# Patient Record
Sex: Male | Born: 1990 | Race: White | Hispanic: No | Marital: Single | Smoking: Never smoker
Health system: Southern US, Community
[De-identification: ages and names within clinical notes are randomized; demographics above are authoritative.]

## PROBLEM LIST (undated history)

## (undated) DIAGNOSIS — D6851 Activated protein C resistance: Secondary | ICD-10-CM

## (undated) DIAGNOSIS — I7300 Raynaud's syndrome without gangrene: Secondary | ICD-10-CM

## (undated) DIAGNOSIS — T7840XA Allergy, unspecified, initial encounter: Secondary | ICD-10-CM

## (undated) DIAGNOSIS — T753XXA Motion sickness, initial encounter: Secondary | ICD-10-CM

## (undated) DIAGNOSIS — J302 Other seasonal allergic rhinitis: Secondary | ICD-10-CM

## (undated) DIAGNOSIS — J45909 Unspecified asthma, uncomplicated: Secondary | ICD-10-CM

## (undated) HISTORY — DX: Other seasonal allergic rhinitis: J30.2

## (undated) HISTORY — DX: Activated protein C resistance: D68.51

## (undated) HISTORY — DX: Allergy, unspecified, initial encounter: T78.40XA

## (undated) HISTORY — PX: NO PAST SURGERIES: SHX2092

---

## 2014-12-18 ENCOUNTER — Ambulatory Visit: Payer: Self-pay | Admitting: Gastroenterology

## 2015-01-05 ENCOUNTER — Ambulatory Visit: Payer: Self-pay | Admitting: Gastroenterology

## 2015-01-07 ENCOUNTER — Other Ambulatory Visit: Payer: Self-pay

## 2015-01-07 DIAGNOSIS — J302 Other seasonal allergic rhinitis: Secondary | ICD-10-CM | POA: Insufficient documentation

## 2015-01-07 DIAGNOSIS — D6851 Activated protein C resistance: Secondary | ICD-10-CM | POA: Insufficient documentation

## 2015-01-07 HISTORY — DX: Other seasonal allergic rhinitis: J30.2

## 2015-01-07 HISTORY — DX: Activated protein C resistance: D68.51

## 2015-01-08 ENCOUNTER — Encounter: Payer: Self-pay | Admitting: Gastroenterology

## 2015-01-08 ENCOUNTER — Ambulatory Visit (INDEPENDENT_AMBULATORY_CARE_PROVIDER_SITE_OTHER): Payer: BC Managed Care – PPO | Admitting: Gastroenterology

## 2015-01-08 VITALS — BP 127/69 | HR 80 | Ht 65.0 in | Wt 139.0 lb

## 2015-01-08 DIAGNOSIS — R197 Diarrhea, unspecified: Secondary | ICD-10-CM

## 2015-01-08 DIAGNOSIS — R140 Abdominal distension (gaseous): Secondary | ICD-10-CM

## 2015-01-08 NOTE — Progress Notes (Signed)
Gastroenterology Consultation  Referring Provider:     No ref. provider found Primary Care Physician:  Rolm Gala, MD Primary Gastroenterologist:  Dr. Servando Snare     Reason for Consultation:     Diarrhea        HPI:   Miguel Rodgers is a 24 y.o. y/o male referred for consultation & management of Diarrhea by Dr. Rolm Gala, MD.  This patient comes today with a history of diarrhea. The patient states his diarrhea started after he took a course of antibiotics. The patient states that he has tried limiting multiple foods and still continues to have diarrhea. He also reports a lot of bloating and gas. Prior to having this diarrhea and bloating patient reports that he was having normal bowel movements. There is no report of any unexplained weight loss fevers chills nausea or vomiting. The patient does report that very infrequently the patient is having diarrhea wake him up from sleep but this is very infrequent. The patient usually states he wakes up for some other reason and has to have a bowel movement. The patient reports that he was on fiber but it made him have more gas. He also reports that he is tried varying robotics and states that they have had mixed results.  Past Medical History  Diagnosis Date  . Allergic rhinitis, seasonal 01/07/2015  . Heterozygous factor V Leiden mutation (HCC) 01/07/2015    History reviewed. No pertinent past surgical history.  Prior to Admission medications   Medication Sig Start Date End Date Taking? Authorizing Provider  cetirizine (ZYRTEC) 10 MG tablet Take by mouth.   Yes Historical Provider, MD  fluticasone (FLONASE) 50 MCG/ACT nasal spray by Nasal route.   Yes Historical Provider, MD  montelukast (SINGULAIR) 10 MG tablet  09/16/13  Yes Historical Provider, MD    Family History  Problem Relation Age of Onset  . Factor V Leiden deficiency Mother   . Lupus Mother   . Sleep apnea Mother   . Prostate cancer Maternal Grandfather      Social History    Substance Use Topics  . Smoking status: Never Smoker   . Smokeless tobacco: Never Used  . Alcohol Use: Yes     Comment: occasional     Allergies as of 01/08/2015 - Review Complete 01/08/2015  Allergen Reaction Noted  . Other Swelling 01/07/2015    Review of Systems:    All systems reviewed and negative except where noted in HPI.   Physical Exam:  BP 127/69 mmHg  Pulse 80  Ht  (1.651 m)  Wt 139 lb (63.05 kg)  BMI 23.13 kg/m2 No LMP for male patient. Psych:  Alert and cooperative. Normal mood and affect. General:   Alert,  Well-developed, well-nourished, pleasant and cooperative in NAD Head:  Normocephalic and atraumatic. Eyes:  Sclera clear, no icterus.   Conjunctiva pink. Ears:  Normal auditory acuity. Nose:  No deformity, discharge, or lesions. Mouth:  No deformity or lesions,oropharynx pink & moist. Neck:  Supple; no masses or thyromegaly. Lungs:  Respirations even and unlabored.  Clear throughout to auscultation.   No wheezes, crackles, or rhonchi. No acute distress. Heart:  Regular rate and rhythm; no murmurs, clicks, rubs, or gallops. Abdomen:  Normal bowel sounds.  No bruits.  Soft, non-tender and non-distended without masses, hepatosplenomegaly or hernias noted.  No guarding or rebound tenderness.  Negative Carnett sign.   Rectal:  Deferred.  Msk:  Symmetrical without gross deformities.  Good, equal movement & strength  bilaterally. Pulses:  Normal pulses noted. Extremities:  No clubbing or edema.  No cyanosis. Neurologic:  Alert and oriented x3;  grossly normal neurologically. Skin:  Intact without significant lesions or rashes.  No jaundice. Lymph Nodes:  No significant cervical adenopathy. Psych:  Alert and cooperative. Normal mood and affect.  Imaging Studies: No results found.  Assessment and Plan:   Miguel Rodgers is a 24 y.o. y/o male who reports having frequent diarrhea almost on a daily basis with loose stools in the morning and then they may get  heartburn in the afternoon. The patient states he can have anywhere from 3-10 bowel movements a day. This all started after he took antibiotics. 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 The patient has been told that he should also try Imodium and fiber when his diarrhea is very bad. If the patient does not get improvement from this the patient will contact me and may need to be set up for colonoscopy to rule out inflammatory bowel disease although this is much less likely. The patient and his family have been explained the plan and agree with it.   Note: This dictation was prepared with Dragon dictation along with smaller phrase technology. Any transcriptional errors that result from this process are unintentional.

## 2015-04-06 ENCOUNTER — Ambulatory Visit (INDEPENDENT_AMBULATORY_CARE_PROVIDER_SITE_OTHER): Payer: BC Managed Care – PPO | Admitting: Gastroenterology

## 2015-04-06 ENCOUNTER — Encounter: Payer: Self-pay | Admitting: Gastroenterology

## 2015-04-06 VITALS — BP 128/75 | HR 70 | Temp 98.1°F | Ht 65.0 in | Wt 142.0 lb

## 2015-04-06 DIAGNOSIS — R197 Diarrhea, unspecified: Secondary | ICD-10-CM | POA: Diagnosis not present

## 2015-04-06 MED ORDER — DESIPRAMINE HCL 50 MG PO TABS: 50.0000 mg | ORAL_TABLET | Freq: Every day | ORAL | Status: DC

## 2015-04-06 NOTE — Progress Notes (Signed)
   Primary Care Physician: Rolm GalaGRANDIS, HEIDI, MD  Primary Gastroenterologist:  Dr. Midge Miniumarren Alaze Garverick  Chief Complaint  Patient presents with  . Diarrhea    HPI: Miguel Rodgers is a 25 y.o. male here for follow-up of his diarrhea. The patient states that he has been taking the Imodium with mixed results. He also increase fiber in his diet with some mixed results. The patient does report that his probiotics have helped greatly but have not made his symptoms go away. He also ports that he continues to have a lot of flatulence. There is no report of any weight loss.  Current Outpatient Prescriptions  Medication Sig Dispense Refill  . Loperamide HCl (IMODIUM PO) Take by mouth as needed.    . Probiotic Product (VSL#3 PO) Take by mouth.    . cetirizine (ZYRTEC) 10 MG tablet Take by mouth. Reported on 04/06/2015    . desipramine (NORPRAMIN) 50 MG tablet Take 1 tablet (50 mg total) by mouth at bedtime. 30 tablet 2  . fluticasone (FLONASE) 50 MCG/ACT nasal spray Reported on 04/06/2015    . montelukast (SINGULAIR) 10 MG tablet Reported on 04/06/2015     No current facility-administered medications for this visit.    Allergies as of 04/06/2015 - Review Complete 04/06/2015  Allergen Reaction Noted  . Other Swelling 01/07/2015    ROS:  General: Negative for anorexia, weight loss, fever, chills, fatigue, weakness. ENT: Negative for hoarseness, difficulty swallowing , nasal congestion. CV: Negative for chest pain, angina, palpitations, dyspnea on exertion, peripheral edema.  Respiratory: Negative for dyspnea at rest, dyspnea on exertion, cough, sputum, wheezing.  GI: See history of present illness. GU:  Negative for dysuria, hematuria, urinary incontinence, urinary frequency, nocturnal urination.  Endo: Negative for unusual weight change.    Physical Examination:   BP 128/75 mmHg  Pulse 70  Temp(Src) 98.1 F (36.7 C) (Oral)  Ht 5\' 5"  (1.651 m)  Wt 142 lb (64.411 kg)  BMI 23.63 kg/m2  General:  Well-nourished, well-developed in no acute distress.  Eyes: No icterus. Conjunctivae pink. Mouth: Oropharyngeal mucosa moist and pink , no lesions erythema or exudate. Lungs: Clear to auscultation bilaterally. Non-labored. Heart: Regular rate and rhythm, no murmurs rubs or gallops.  Abdomen: Bowel sounds are normal, nontender, nondistended, no hepatosplenomegaly or masses, no abdominal bruits or hernia , no rebound or guarding.   Extremities: No lower extremity edema. No clubbing or deformities. Neuro: Alert and oriented x 3.  Grossly intact. Skin: Warm and dry, no jaundice.   Psych: Alert and cooperative, normal mood and affect.  Labs:    Imaging Studies: No results found.  Assessment and Plan:   Miguel Rodgers is a 25 y.o. y/o male who has a history of diarrhea after taking antibiotics. The patient continues to have diarrhea despite being helped by his VSL3 probiotics. The patient is hesitant to undergo colonoscopy which was recommended at the last office visit. The patient would like to try Desipramine before he goes to bed to see if this helps his symptoms. He will also contact us if he decides to go through with a colonoscopy to rule out any inflammatory bowel disease.   Note: This dictation was prepared with Dragon dictation along with smaller phrase technology. Any transcriptional errors that result from this process are unintentional.

## 2015-04-07 ENCOUNTER — Other Ambulatory Visit: Payer: Self-pay

## 2015-04-17 ENCOUNTER — Other Ambulatory Visit
Admission: RE | Admit: 2015-04-17 | Discharge: 2015-04-17 | Disposition: A | Discharge status: Home / Self Care | Payer: BC Managed Care – PPO | Source: Ambulatory Visit | Attending: Gastroenterology | Admitting: Gastroenterology

## 2015-04-17 ENCOUNTER — Inpatient Hospital Stay
Admission: EM | Admit: 2015-04-17 | Discharge: 2015-04-17 | Discharge status: Absent without leave | Payer: Self-pay | Source: Home / Self Care

## 2015-04-17 ENCOUNTER — Inpatient Hospital Stay: Admit: 2015-04-17 | Payer: Self-pay

## 2015-04-17 DIAGNOSIS — R197 Diarrhea, unspecified: Secondary | ICD-10-CM | POA: Insufficient documentation

## 2015-04-21 LAB — CELIAC DISEASE PANEL
Endomysial Ab, IgA: NEGATIVE
IGA: 233 mg/dL (ref 90–386)

## 2015-04-22 ENCOUNTER — Telehealth: Payer: Self-pay

## 2015-04-22 NOTE — Telephone Encounter (Signed)
-----   Message from Midge Minium, MD sent at 04/21/2015 10:46 AM EST ----- Let the patient know that his blood test for celiac sprue was negative.

## 2015-04-22 NOTE — Telephone Encounter (Signed)
LVM letting pt know his lab result was normal.

## 2015-04-28 ENCOUNTER — Encounter: Payer: Self-pay | Admitting: *Deleted

## 2015-04-28 NOTE — Discharge Instructions (Signed)

## 2015-05-01 ENCOUNTER — Ambulatory Visit
Admission: RE | Admit: 2015-05-01 | Discharge: 2015-05-01 | Disposition: A | Discharge status: Home / Self Care | Payer: BC Managed Care – PPO | Source: Ambulatory Visit | Attending: Gastroenterology | Admitting: Gastroenterology

## 2015-05-01 ENCOUNTER — Other Ambulatory Visit: Payer: Self-pay | Admitting: Gastroenterology

## 2015-05-01 ENCOUNTER — Encounter
Admission: RE | Disposition: A | Discharge status: Home / Self Care | Payer: Self-pay | Source: Ambulatory Visit | Attending: Gastroenterology

## 2015-05-01 ENCOUNTER — Ambulatory Visit: Payer: BC Managed Care – PPO | Admitting: Student in an Organized Health Care Education/Training Program

## 2015-05-01 DIAGNOSIS — Z8042 Family history of malignant neoplasm of prostate: Secondary | ICD-10-CM | POA: Insufficient documentation

## 2015-05-01 DIAGNOSIS — R197 Diarrhea, unspecified: Secondary | ICD-10-CM | POA: Diagnosis present

## 2015-05-01 DIAGNOSIS — K641 Second degree hemorrhoids: Secondary | ICD-10-CM | POA: Diagnosis not present

## 2015-05-01 DIAGNOSIS — Z832 Family history of diseases of the blood and blood-forming organs and certain disorders involving the immune mechanism: Secondary | ICD-10-CM | POA: Diagnosis not present

## 2015-05-01 DIAGNOSIS — J45909 Unspecified asthma, uncomplicated: Secondary | ICD-10-CM | POA: Diagnosis not present

## 2015-05-01 DIAGNOSIS — I7300 Raynaud's syndrome without gangrene: Secondary | ICD-10-CM | POA: Insufficient documentation

## 2015-05-01 DIAGNOSIS — Z79899 Other long term (current) drug therapy: Secondary | ICD-10-CM | POA: Diagnosis not present

## 2015-05-01 DIAGNOSIS — D6851 Activated protein C resistance: Secondary | ICD-10-CM | POA: Insufficient documentation

## 2015-05-01 DIAGNOSIS — K529 Noninfective gastroenteritis and colitis, unspecified: Secondary | ICD-10-CM | POA: Diagnosis not present

## 2015-05-01 HISTORY — DX: Unspecified asthma, uncomplicated: J45.909

## 2015-05-01 HISTORY — DX: Motion sickness, initial encounter: T75.3XXA

## 2015-05-01 HISTORY — PX: COLONOSCOPY WITH PROPOFOL: SHX5780

## 2015-05-01 HISTORY — DX: Raynaud's syndrome without gangrene: I73.00

## 2015-05-01 SURGERY — COLONOSCOPY WITH PROPOFOL
Anesthesia: Monitor Anesthesia Care

## 2015-05-01 MED ORDER — STERILE WATER FOR IRRIGATION IR SOLN
Status: DC | PRN
  Administered 2015-05-01: 10:00:00

## 2015-05-01 MED ORDER — LACTATED RINGERS IV SOLN
INTRAVENOUS | Status: DC
  Administered 2015-05-01: 09:00:00 via INTRAVENOUS

## 2015-05-01 MED ORDER — PROPOFOL 10 MG/ML IV BOLUS
INTRAVENOUS | Status: DC | PRN
  Administered 2015-05-01: 40 mg via INTRAVENOUS
  Administered 2015-05-01: 150 mg via INTRAVENOUS
  Administered 2015-05-01: 40 mg via INTRAVENOUS
  Administered 2015-05-01: 50 mg via INTRAVENOUS
  Administered 2015-05-01: 40 mg via INTRAVENOUS

## 2015-05-01 MED ORDER — SODIUM CHLORIDE 0.9 % IV SOLN: INTRAVENOUS | Status: DC

## 2015-05-01 MED ORDER — LIDOCAINE HCL (CARDIAC) 20 MG/ML IV SOLN
INTRAVENOUS | Status: DC | PRN
  Administered 2015-05-01: 50 mg via INTRAVENOUS

## 2015-05-01 SURGICAL SUPPLY — 28 items

## 2015-05-01 NOTE — Transfer of Care (Signed)
Immediate Anesthesia Transfer of Care Note  Patient: Miguel Rodgers  Procedure(s) Performed: Procedure(s): COLONOSCOPY WITH PROPOFOL (N/A)  Patient Location: PACU  Anesthesia Type: MAC  Level of Consciousness: awake, alert  and patient cooperative  Airway and Oxygen Therapy: Patient Spontanous Breathing and Patient connected to supplemental oxygen  Post-op Assessment: Post-op Vital signs reviewed, Patient's Cardiovascular Status Stable, Respiratory Function Stable, Patent Airway and No signs of Nausea or vomiting  Post-op Vital Signs: Reviewed and stable  Complications: No apparent anesthesia complications

## 2015-05-01 NOTE — Op Note (Signed)
[facility] Gastroenterology Patient Name: Miguel Rodgers Procedure Date: 05/01/2015 9:22 AM MRN: [id number] Account #: [id number] Date of Birth: 1990/12/24 Admit Type: Outpatient Age: 25 Room: [facility] OR ROOM 01 Gender: Male Note Status: Finalized Procedure:         Colonoscopy Indications:       Chronic diarrhea Providers:         Midge Minium, MD Referring MD:      Letitia Caul, MD (Referring MD) Medicines:         Propofol per Anesthesia Complications:     No immediate complications. Procedure:         Pre-Anesthesia Assessment:                    - Prior to the procedure, a History and Physical was                     performed, and patient medications and allergies were                     reviewed. The patient's tolerance of previous anesthesia                     was also reviewed. The risks and benefits of the procedure                     and the sedation options and risks were discussed with the                     patient. All questions were answered, and informed consent                     was obtained. Prior Anticoagulants: The patient has taken                     no previous anticoagulant or antiplatelet agents. ASA                     Grade Assessment: II - A patient with mild systemic                     disease. After reviewing the risks and benefits, the                     patient was deemed in satisfactory condition to undergo                     the procedure.                    After obtaining informed consent, the colonoscope was                     passed under direct vision. Throughout the procedure, the                     patient's blood pressure, pulse, and oxygen saturations                     were monitored continuously. The Olympus CF-HQ190L                     Colonoscope (S#. [id number]) was introduced through the anus  and advanced to the the terminal ileum. The colonoscopy                     was performed  without difficulty. The patient tolerated                     the procedure well. The quality of the bowel preparation                     was excellent. Findings:      The perianal and digital rectal examinations were normal.      The terminal ileum appeared normal. Biopsies were obtained in the       terminal ileum with cold forceps for histology.      Non-bleeding internal hemorrhoids were found during retroflexion. The       hemorrhoids were Grade II (internal hemorrhoids that prolapse but reduce       spontaneously).      Random biopsies were obtained with cold forceps for histology randomly       in the entire colon. Impression:        - The examined portion of the ileum was normal.                    - Non-bleeding internal hemorrhoids.                    - Biopsies were obtained in the terminal ileum.                    - Random biopsies were obtained in the entire colon. Recommendation:    - Await pathology results. Procedure Code(s): --- Professional ---                    [phone], Colonoscopy, flexible; with biopsy, single or                     multiple Diagnosis Code(s): --- Professional ---                    K52.9, Noninfective gastroenteritis and colitis,                     unspecified CPT copyright 2014 American Medical Association. All rights reserved. The codes documented in this report are preliminary and upon coder review may  be revised to meet current compliance requirements. Midge Minium, MD 05/01/2015 9:53:07 AM This report has been signed electronically. Number of Addenda: 0 Note Initiated On: 05/01/2015 9:22 AM Scope Withdrawal Time: 0 hours 6 minutes 24 seconds  Total Procedure Duration: 0 hours 7 minutes 55 seconds       [facility]

## 2015-05-01 NOTE — H&P (Signed)
  [facility]  [address], [address] [place], [place] [address] Phone: [phone] Fax : [phone]  Primary Care Physician:  Rolm Gala, MD Primary Gastroenterologist:  Dr. Servando Snare  Pre-Procedure History & Physical: HPI:  Miguel Rodgers is a 25 y.o. male is here for an colonoscopy.   Past Medical History  Diagnosis Date  . Allergic rhinitis, seasonal 01/07/2015  . Heterozygous factor V Leiden mutation (HCC) 01/07/2015  . Raynaud's syndrome   . Asthma     when seasonal allergies are severe  . Motion sickness     all moving vehicles    Past Surgical History  Procedure Laterality Date  . No past surgeries      Prior to Admission medications   Medication Sig Start Date End Date Taking? Authorizing Provider  desipramine (NORPRAMIN) 50 MG tablet Take 1 tablet (50 mg total) by mouth at bedtime. 04/06/15  Yes Midge Minium, MD  Loperamide HCl (IMODIUM PO) Take by mouth as needed.   Yes Historical Provider, MD  Probiotic Product (VSL#3 PO) Take by mouth.   Yes Historical Provider, MD  simethicone (MYLICON) 80 MG chewable tablet Chew 80 mg by mouth every 6 (six) hours as needed for flatulence.   Yes Historical Provider, MD    Allergies as of 04/07/2015 - Review Complete 04/06/2015  Allergen Reaction Noted  . Other Swelling 01/07/2015    Family History  Problem Relation Age of Onset  . Factor V Leiden deficiency Mother   . Lupus Mother   . Sleep apnea Mother   . Prostate cancer Maternal Grandfather     Social History   Social History  . Marital Status: Single    Spouse Name: N/A  . Number of Children: N/A  . Years of Education: N/A   Occupational History  . Not on file.   Social History Main Topics  . Smoking status: Never Smoker   . Smokeless tobacco: Never Used  . Alcohol Use: No     Comment: occasional   . Drug Use: No  . Sexual Activity: Not on file   Other Topics Concern  . Not on file   Social History Narrative    Review of Systems: See  HPI, otherwise negative ROS  Physical Exam: Ht  (1.651 m)  Wt 142 lb (64.411 kg)  BMI 23.63 kg/m2 General:   Alert,  pleasant and cooperative in NAD Head:  Normocephalic and atraumatic. Neck:  Supple; no masses or thyromegaly. Lungs:  Clear throughout to auscultation.    Heart:  Regular rate and rhythm. Abdomen:  Soft, nontender and nondistended. Normal bowel sounds, without guarding, and without rebound.   Neurologic:  Alert and  oriented x4;  grossly normal neurologically.  Impression/Plan: Miguel Rodgers is here for an colonoscopy to be performed for diarrhea  Risks, benefits, limitations, and alternatives regarding  colonoscopy have been reviewed with the patient.  Questions have been answered.  All parties agreeable.   Darlina Rumpf, MD  05/01/2015, 8:55 AM

## 2015-05-01 NOTE — Anesthesia Procedure Notes (Signed)
Procedure Name: MAC Performed by: Carmelia Tiner Pre-anesthesia Checklist: Patient identified, Emergency Drugs available, Suction available, Timeout performed and Patient being monitored Patient Re-evaluated:Patient Re-evaluated prior to inductionOxygen Delivery Method: Nasal cannula Placement Confirmation: positive ETCO2     

## 2015-05-01 NOTE — Anesthesia Postprocedure Evaluation (Signed)
Anesthesia Post Note  Patient: Miguel Rodgers  Procedure(s) Performed: Procedure(s) (LRB): COLONOSCOPY WITH PROPOFOL (N/A)  Patient location during evaluation: PACU Anesthesia Type: MAC Level of consciousness: awake and alert and oriented Pain management: pain level controlled Vital Signs Assessment: post-procedure vital signs reviewed and stable Respiratory status: spontaneous breathing and nonlabored ventilation Cardiovascular status: stable Postop Assessment: no signs of nausea or vomiting and adequate PO intake Anesthetic complications: no    Harolyn Rutherford

## 2015-05-01 NOTE — Anesthesia Preprocedure Evaluation (Signed)
Anesthesia Evaluation  Patient identified by MRN, date of birth, ID band Patient awake    Reviewed: Allergy & Precautions, NPO status , Patient's Chart, lab work & pertinent test results, reviewed documented beta blocker date and time   Airway Mallampati: I  TM Distance: >3 FB Neck ROM: Full    Dental no notable dental hx.    Pulmonary asthma ,    Pulmonary exam normal        Cardiovascular + Peripheral Vascular Disease  Normal cardiovascular exam     Neuro/Psych negative neurological ROS  negative psych ROS   GI/Hepatic negative GI ROS, Neg liver ROS,   Endo/Other  negative endocrine ROS  Renal/GU negative Renal ROS     Musculoskeletal negative musculoskeletal ROS (+)   Abdominal   Peds  Hematology negative hematology ROS (+)   Anesthesia Other Findings   Reproductive/Obstetrics                             Anesthesia Physical Anesthesia Plan  ASA: I  Anesthesia Plan: MAC   Post-op Pain Management:    Induction: Intravenous  Airway Management Planned:   Additional Equipment:   Intra-op Plan:   Post-operative Plan:   Informed Consent: I have reviewed the patients History and Physical, chart, labs and discussed the procedure including the risks, benefits and alternatives for the proposed anesthesia with the patient or authorized representative who has indicated his/her understanding and acceptance.     Plan Discussed with: CRNA  Anesthesia Plan Comments:         Anesthesia Quick Evaluation

## 2015-05-04 ENCOUNTER — Encounter: Payer: Self-pay | Admitting: Gastroenterology

## 2015-05-07 ENCOUNTER — Encounter: Payer: Self-pay | Admitting: Gastroenterology

## 2015-06-01 ENCOUNTER — Telehealth: Payer: Self-pay

## 2015-06-01 NOTE — Telephone Encounter (Signed)
Pt called stating the desipramine is making him have a slight shaky feeling in his hands. He wanted to know if this was a normal side effect of this medication. No other symptoms noted. Please advise.

## 2015-06-01 NOTE — Telephone Encounter (Signed)
Yes it can be associated with movement disorders but it means he needs to STOP the medication.

## 2015-06-02 NOTE — Telephone Encounter (Signed)
LVM letting pt know per Dr. Servando SnareWohl, this could be a side effect and for him to stop medication.

## 2015-06-03 NOTE — Telephone Encounter (Signed)
Pt called back and scheduled a follow up appt.

## 2015-06-05 DIAGNOSIS — K580 Irritable bowel syndrome with diarrhea: Secondary | ICD-10-CM | POA: Insufficient documentation

## 2015-07-01 ENCOUNTER — Ambulatory Visit (INDEPENDENT_AMBULATORY_CARE_PROVIDER_SITE_OTHER): Payer: BC Managed Care – PPO | Admitting: Gastroenterology

## 2015-07-01 ENCOUNTER — Encounter: Payer: Self-pay | Admitting: Gastroenterology

## 2015-07-01 ENCOUNTER — Other Ambulatory Visit: Payer: Self-pay

## 2015-07-01 VITALS — BP 127/73 | HR 74 | Temp 98.2°F | Ht 65.0 in | Wt 144.0 lb

## 2015-07-01 DIAGNOSIS — K589 Irritable bowel syndrome without diarrhea: Secondary | ICD-10-CM

## 2015-07-01 MED ORDER — CITALOPRAM HYDROBROMIDE 10 MG PO TABS: 10.0000 mg | ORAL_TABLET | Freq: Every day | ORAL | Status: DC

## 2015-07-01 NOTE — Progress Notes (Signed)
   Primary Care Physician: Rolm GalaGRANDIS, HEIDI, MD  Primary Gastroenterologist:  Dr. Midge Miniumarren [place]  Chief Complaint  Patient presents with  . follow up colonoscopy  . Diarrhea    HPI: Miguel Rodgers is a 25 y.o. male here for follow-up of his irritable bowel syndrome. The patient was doing much better on the desipramine but had side effects. He reports that after stopping the medication because of having shakes he looked back and is noted found out that he had tremors even before starting the medication. The patient states that when he was on the medication they just became more frequent. The patient has been doing well recently except for symptoms of come back. The colonoscopy did not show any cause for his symptoms. The patient would like to try another medication for his irritable bowel syndrome.  Current Outpatient Prescriptions  Medication Sig Dispense Refill  . diphenhydrAMINE (BENADRYL) 25 mg capsule Take by mouth.    . Loperamide HCl (IMODIUM PO) Take by mouth as needed.    . citalopram (CELEXA) 10 MG tablet Take 1 tablet (10 mg total) by mouth daily. 30 tablet 4  . desipramine (NORPRAMIN) 50 MG tablet Take 1 tablet (50 mg total) by mouth at bedtime. (Patient not taking: Reported on 07/01/2015) 30 tablet 2  . dicyclomine (BENTYL) 20 MG tablet Take by mouth. Reported on 07/01/2015    . Probiotic Product (VSL#3 PO) Take by mouth. Reported on 07/01/2015    . simethicone (MYLICON) 80 MG chewable tablet Chew 80 mg by mouth every 6 (six) hours as needed for flatulence. Reported on 07/01/2015     No current facility-administered medications for this visit.    Allergies as of 07/01/2015 - Review Complete 07/01/2015  Allergen Reaction Noted  . Other Swelling 01/07/2015    ROS:  General: Negative for anorexia, weight loss, fever, chills, fatigue, weakness. ENT: Negative for hoarseness, difficulty swallowing , nasal congestion. CV: Negative for chest pain, angina, palpitations, dyspnea on exertion,  peripheral edema.  Respiratory: Negative for dyspnea at rest, dyspnea on exertion, cough, sputum, wheezing.  GI: See history of present illness. GU:  Negative for dysuria, hematuria, urinary incontinence, urinary frequency, nocturnal urination.  Endo: Negative for unusual weight change.    Physical Examination:   BP 127/73 mmHg  Pulse 74  Temp(Src) 98.2 F (36.8 C) (Oral)  Ht 5\' 5"  (1.651 m)  Wt 144 lb (65.318 kg)  BMI 23.96 kg/m2  General: Well-nourished, well-developed in no acute distress.  Eyes: No icterus. Conjunctivae pink. Skin: Warm and dry, no jaundice.   Psych: Alert and cooperative, normal mood and affect.  Labs:    Imaging Studies: No results found.  Assessment and Plan:   Miguel ColonelJacob Dambrosio is a 25 y.o. y/o male comes in today with continued irritable bowel syndrome symptoms. He was completely symptom free on on the try cyclic antidepressants and had side effects. The patient will be tried on Celexa. The patient has been reassured that irritable bowel syndrome is what is affecting him. The patient has been told to contact me if this medication does not work.   Note: This dictation was prepared with Dragon dictation along with smaller phrase technology. Any transcriptional errors that result from this process are unintentional.

## 2015-07-13 ENCOUNTER — Telehealth: Payer: Self-pay

## 2015-07-21 NOTE — Telephone Encounter (Signed)
LVM for pt to return my call.

## 2015-07-21 NOTE — Telephone Encounter (Signed)
Miguel Rodgers would like for you to call him asap regarding some medication. He has been waiting for you to call him back. He also wanted me to tell you he made an appointment with a neurologist on [place] in [place].

## 2015-07-23 NOTE — Telephone Encounter (Signed)
[facility]

## 2015-07-23 NOTE — Telephone Encounter (Signed)
After reviewing the chart and speaking with the nurse, patient will need a referral [facility] for further workup of IBS as he has been having abnormal side effects to 2 medications that have already been given.   Patient is in agreement with this.  Please create referral at this time and have patient seen as soon as possible.

## 2015-07-27 NOTE — Telephone Encounter (Signed)
I have faxed a referral with clinic notes, labs, pathology and imaging to [facility] @ [phone]. [facility] by [facility] with [facility] date and time once the referral is reviewed. I have spoken with a receptionist at [facility] and she states it may take up to 2 weeks to make this appointment. I have advised that patient.

## 2015-08-21 ENCOUNTER — Ambulatory Visit
Admission: RE | Admit: 2015-08-21 | Discharge: 2015-08-21 | Disposition: A | Discharge status: Home / Self Care | Payer: BC Managed Care – PPO | Source: Ambulatory Visit | Attending: Medical Oncology | Admitting: Medical Oncology

## 2015-08-21 ENCOUNTER — Encounter: Payer: Self-pay | Admitting: Emergency Medicine

## 2015-08-21 ENCOUNTER — Other Ambulatory Visit: Payer: Self-pay | Admitting: Medical Oncology

## 2015-08-21 ENCOUNTER — Emergency Department
Admission: EM | Admit: 2015-08-21 | Discharge: 2015-08-21 | Disposition: A | Discharge status: Home / Self Care | Payer: BC Managed Care – PPO | Attending: Emergency Medicine | Admitting: Emergency Medicine

## 2015-08-21 DIAGNOSIS — M79662 Pain in left lower leg: Secondary | ICD-10-CM | POA: Insufficient documentation

## 2015-08-21 DIAGNOSIS — D6851 Activated protein C resistance: Secondary | ICD-10-CM | POA: Diagnosis not present

## 2015-08-21 DIAGNOSIS — I82412 Acute embolism and thrombosis of left femoral vein: Secondary | ICD-10-CM | POA: Diagnosis present

## 2015-08-21 DIAGNOSIS — I240 Acute coronary thrombosis not resulting in myocardial infarction: Secondary | ICD-10-CM | POA: Insufficient documentation

## 2015-08-21 DIAGNOSIS — J45909 Unspecified asthma, uncomplicated: Secondary | ICD-10-CM | POA: Diagnosis not present

## 2015-08-21 DIAGNOSIS — I82432 Acute embolism and thrombosis of left popliteal vein: Secondary | ICD-10-CM

## 2015-08-21 DIAGNOSIS — I82402 Acute embolism and thrombosis of unspecified deep veins of left lower extremity: Secondary | ICD-10-CM | POA: Insufficient documentation

## 2015-08-21 DIAGNOSIS — I7300 Raynaud's syndrome without gangrene: Secondary | ICD-10-CM | POA: Insufficient documentation

## 2015-08-21 LAB — BASIC METABOLIC PANEL
ANION GAP: 12 (ref 5–15)
BUN: 12 mg/dL (ref 6–20)
CHLORIDE: 101 mmol/L (ref 101–111)
CO2: 25 mmol/L (ref 22–32)
CREATININE: 0.87 mg/dL (ref 0.61–1.24)
Calcium: 9.9 mg/dL (ref 8.9–10.3)
GFR calc non Af Amer: >60 mL/min (ref >60)
GLUCOSE: 94 mg/dL (ref 65–99)
Potassium: 3.9 mmol/L (ref 3.5–5.1)
Sodium: 138 mmol/L (ref 135–145)

## 2015-08-21 LAB — CBC WITH DIFFERENTIAL/PLATELET
Basophils Absolute: 0 10*3/uL (ref 0–0.1)
Eosinophils Absolute: 0 10*3/uL (ref 0–0.7)
Eosinophils Relative: 0 %
HEMATOCRIT: 46.4 % (ref 40.0–52.0)
HEMOGLOBIN: 16 g/dL (ref 13.0–18.0)
Lymphs Abs: 0.9 10*3/uL — ABNORMAL LOW (ref 1.0–3.6)
MCH: 28.9 pg (ref 26.0–34.0)
MCHC: 34.6 g/dL (ref 32.0–36.0)
MCV: 83.6 fL (ref 80.0–100.0)
Monocytes Absolute: 0.9 10*3/uL (ref 0.2–1.0)
NEUTROS ABS: 11.1 10*3/uL — AB (ref 1.4–6.5)
Platelets: 169 10*3/uL (ref 150–440)
RBC: 5.55 MIL/uL (ref 4.40–5.90)
RDW: 12.8 % (ref 11.5–14.5)
WBC: 13 10*3/uL — ABNORMAL HIGH (ref 3.8–10.6)

## 2015-08-21 LAB — PROTIME-INR
INR: 1.16
Prothrombin Time: 15 seconds (ref 11.4–15.0)

## 2015-08-21 LAB — APTT: APTT: 31 s (ref 24–36)

## 2015-08-21 MED ORDER — HYDROCODONE-ACETAMINOPHEN 5-325 MG PO TABS: 1.0000 | ORAL_TABLET | ORAL | Status: DC | PRN

## 2015-08-21 MED ORDER — APIXABAN 5 MG PO TABS: ORAL_TABLET | ORAL | Status: DC

## 2015-08-21 MED ORDER — APIXABAN 5 MG PO TABS
10.0000 mg | ORAL_TABLET | ORAL | Status: AC
  Administered 2015-08-21: 10 mg via ORAL
  Filled 2015-08-21: qty 2

## 2015-08-21 MED ORDER — DOCUSATE SODIUM 100 MG PO CAPS: ORAL_CAPSULE | ORAL | Status: DC

## 2015-08-21 NOTE — ED Notes (Addendum)
Pt states he started having left leg pain 2 weeks ago, it did get better, but states 2 nights ago he slept on it wrong and pain has been increased ever since. Pt c/o pain to calf, behind the knee, and to thigh.  Pt did have US done today that was positive for DVT.  Pt has Factor V syndrome along with mother who also has the same and with DVT hx.

## 2015-08-21 NOTE — ED Provider Notes (Signed)
[facility] Emergency Department Provider Note  ____________________________________________  Time seen: Approximately 6:35 PM  I have reviewed the triage vital signs and the nursing notes.   HISTORY  Chief Complaint DVT    HPI Miguel Rodgers is a 25 y.o. male with a known history of factor V Leiden mutation who presents with vaginal onset of left leg pain for about 2 weeks but that it got acutely worse 2 days ago.  He describes the pain as moderate, worse with ambulation, and is a deep aching pain that starts in the middle of his thigh and extends down and slightly below his knee.He denies fever/chills, chest pain, shortness of breath, nausea, vomiting, diarrhea, abdominal pain.  He had an ultrasound done as an outpatient today which revealed a new DVT.  He has never had a DVT or any Blood clot in the past although his mother has had multiple blood clots.  He denies any numbness or tingling in the extremity.  He states that it is warm to the touch.  He is able to walk even that is somewhat painful.  He drives it as feeling tight and swollen although it does not look much different from normal.   Past Medical History  Diagnosis Date  . Allergic rhinitis, seasonal 01/07/2015  . Heterozygous factor V Leiden mutation ([place]) 01/07/2015  . Raynaud's syndrome   . Asthma     when seasonal allergies are severe  . Motion sickness     all moving vehicles    Patient Active Problem List   Diagnosis Date Noted  . Irritable bowel syndrome with diarrhea 06/05/2015  . Noninfectious diarrhea   . Heterozygous factor V Leiden mutation ([place]) 01/07/2015  . Allergic rhinitis, seasonal 01/07/2015    Past Surgical History  Procedure Laterality Date  . No past surgeries    . Colonoscopy with propofol N/A 05/01/2015    Procedure: COLONOSCOPY WITH PROPOFOL;  Surgeon: Lucilla Lame, MD;  Location: [place];  Service: Endoscopy;  Laterality: N/A;    Current  Outpatient Rx  Name  Route  Sig  Dispense  Refill  . desipramine (NORPRAMIN) 50 MG tablet   Oral   Take 1 tablet (50 mg total) by mouth at bedtime. Patient taking differently: Take 20 mg by mouth at bedtime.    30 tablet   2   . apixaban (ELIQUIS) 5 MG TABS tablet      Take 2 tablets (10 mg) by mouth twice daily for 7 days.  Then take 1 tablet (5 mg) by mouth twice daily.   74 tablet   1   . citalopram (CELEXA) 10 MG tablet   Oral   Take 1 tablet (10 mg total) by mouth daily.   30 tablet   4   . dicyclomine (BENTYL) 20 MG tablet   Oral   Take by mouth. Reported on 07/01/2015         . diphenhydrAMINE (BENADRYL) 25 mg capsule   Oral   Take by mouth.         . docusate sodium (COLACE) 100 MG capsule      Take 1 tablet once or twice daily as needed for constipation while taking narcotic pain medicine   30 capsule   0   . HYDROcodone-acetaminophen (NORCO/VICODIN) 5-325 MG tablet   Oral   Take 1-2 tablets by mouth every 4 (four) hours as needed for moderate pain.   15 tablet   0   . Loperamide HCl (IMODIUM  PO)   Oral   Take by mouth as needed.         . Probiotic Product (VSL#3 PO)   Oral   Take by mouth. Reported on 07/01/2015         . simethicone (MYLICON) 80 MG chewable tablet   Oral   Chew 80 mg by mouth every 6 (six) hours as needed for flatulence. Reported on 07/01/2015           Allergies Other  Family History  Problem Relation Age of Onset  . Factor V Leiden deficiency Mother   . Lupus Mother   . Sleep apnea Mother   . Prostate cancer Maternal Grandfather     Social History Social History  Substance Use Topics  . Smoking status: Never Smoker   . Smokeless tobacco: Never Used  . Alcohol Use: No     Comment: occasional     Review of Systems Constitutional: No fever/chills Eyes: No visual changes. ENT: No sore throat. Cardiovascular: Denies chest pain. Respiratory: Denies shortness of breath. Gastrointestinal: No abdominal pain.   No nausea, no vomiting.  No diarrhea.  No constipation. Genitourinary: Negative for dysuria. Musculoskeletal: Negative for back pain. Skin: Negative for rash. Neurological: Negative for headaches, focal weakness or numbness.  10-point ROS otherwise negative.  ____________________________________________   PHYSICAL EXAM:  VITAL SIGNS: ED Triage Vitals  Enc Vitals Group     BP 08/21/15 1651 133/76 mmHg     Pulse Rate 08/21/15 1651 100     Resp 08/21/15 1651 18     Temp 08/21/15 1651 98.4 F (36.9 C)     Temp Source 08/21/15 1651 Oral     SpO2 08/21/15 1651 100 %     Weight 08/21/15 1651 140 lb (63.504 kg)     Height 08/21/15 1651 5' 5"  (1.651 m)     Head Cir --      Peak Flow --      Pain Score 08/21/15 1653 8     Pain Loc --      Pain Edu? --      Excl. in [place]? --     Constitutional: Alert and oriented. Well appearing and in no acute distress. Eyes: Conjunctivae are normal. PERRL. EOMI. Head: Atraumatic. Nose: No congestion/rhinnorhea. Mouth/Throat: Mucous membranes are moist.  Oropharynx non-erythematous. Neck: No stridor.  No meningeal signs.   Cardiovascular: Normal rate, regular rhythm. Good peripheral circulation. Grossly normal heart sounds.   Respiratory: Normal respiratory effort.  No retractions. Lungs CTAB. Gastrointestinal: Soft and nontender. No distention.  Musculoskeletal: No appreciable difference between the left and right legs.  Slightly decreased capillary refill on the top of the left foot but the foot is warm with palpable pulses that are equal between the 2 feet. Neurologic:  Normal speech and language. No gross focal neurologic deficits are appreciated.  Skin:  Skin is warm, dry and intact. No rash noted.   ____________________________________________   LABS (all labs ordered are listed, but only abnormal results are displayed)  Labs Reviewed  CBC WITH DIFFERENTIAL/PLATELET - Abnormal; Notable for the following:    WBC 13.0 (*)    Neutro Abs  11.1 (*)    Lymphs Abs 0.9 (*)    All other components within normal limits  BASIC METABOLIC PANEL  PROTIME-INR  APTT   ____________________________________________  EKG  None ____________________________________________  RADIOLOGY   US Venous Img Lower Unilateral Left  08/21/2015  CLINICAL DATA:  Left calf pain and edema. History of factor 5  Leiden. EXAM: LEFT LOWER EXTREMITY VENOUS DOPPLER ULTRASOUND TECHNIQUE: Gray-scale sonography with graded compression, as well as color Doppler and duplex ultrasound were performed to evaluate the lower extremity deep venous systems from the level of the common femoral vein and including the common femoral, femoral, profunda femoral, popliteal and calf veins including the posterior tibial, peroneal and gastrocnemius veins when visible. The superficial great saphenous vein was also interrogated. Spectral Doppler was utilized to evaluate flow at rest and with distal augmentation maneuvers in the common femoral, femoral and popliteal veins. COMPARISON:  None. FINDINGS: Contralateral Common Femoral Vein: Respiratory phasicity is normal and symmetric with the symptomatic side. No evidence of thrombus. Normal compressibility. Common Femoral Vein: No evidence of thrombus. Normal compressibility, respiratory phasicity and response to augmentation. Saphenofemoral Junction: No evidence of thrombus. Normal compressibility and flow on color Doppler imaging. Profunda Femoral Vein: No evidence of thrombus. Normal compressibility, respiratory phasicity and response to augmentation. Femoral Vein: Beginning in the mid thigh, there is evidence of DVT within the femoral vein which is nonocclusive in the mid thigh and becomes occlusive in the distal thigh. Popliteal Vein: Occlusive thrombus identified throughout the popliteal vein. Calf Veins: Thrombus extends into visualized peroneal and posterior tibial veins. Superficial Great Saphenous Vein: No evidence of thrombus. Normal  compressibility and flow on color Doppler imaging. Venous Reflux:  None. Other Findings: No evidence of superficial thrombophlebitis or abnormal fluid collection. IMPRESSION: The exam is positive for DVT of the left lower extremity beginning in the femoral vein at the mid thigh level. Occlusive thrombus is present in the distal thigh, popliteal vein and visualized calf veins. Electronically Signed   By: Aletta Edouard M.D.   On: 08/21/2015 16:07    ____________________________________________   PROCEDURES  Procedure(s) performed: None  Critical Care performed: No ____________________________________________   INITIAL IMPRESSION / ASSESSMENT AND PLAN / ED COURSE  Pertinent labs & imaging results that were available during my care of the patient were reviewed by me and considered in my medical decision making (see chart for details).  The patient has acute DVT in the setting of factor V Leiden mutation.  He has not followed by a hematologist and is not on any anticoagulation.  I spoke with Dr. Yevette Edwards and ask about preference of anticoagulant for this patient.  He recommended that I speak with vascular surgery to make sure they did not want to do thrombolytics.  I spoke with Dr. Lorenso Courier by phone, went over all the details of the physical exam and ultrasound findings, and she agreed that there is no indication for thrombolytics at this time.  She also agreed with the plan to start the patient on Eliquis.  I started him on the first dose of Eliquis since the pharmacies are closed and provided a prescription.  He will follow up with the otologist in about 2 weeks.  He provided the Eliquis starter kit for a discount at the pharmacy.   ____________________________________________  FINAL CLINICAL IMPRESSION(S) / ED DIAGNOSES  Final diagnoses:  Acute deep vein thrombosis (DVT) of femoral vein of left lower extremity (HCC)  Acute deep vein thrombosis (DVT) of popliteal vein of left lower extremity  (HCC)  Factor V Leiden ([place])     MEDICATIONS GIVEN DURING THIS VISIT:  Medications  apixaban (ELIQUIS) tablet 10 mg (10 mg Oral Given 08/21/15 2022)     NEW OUTPATIENT MEDICATIONS STARTED DURING THIS VISIT:  Discharge Medication List as of 08/21/2015  8:08 PM    START taking these medications  Details  apixaban (ELIQUIS) 5 MG TABS tablet Take 2 tablets (10 mg) by mouth twice daily for 7 days.  Then take 1 tablet (5 mg) by mouth twice daily., Print    docusate sodium (COLACE) 100 MG capsule Take 1 tablet once or twice daily as needed for constipation while taking narcotic pain medicine, Print    HYDROcodone-acetaminophen (NORCO/VICODIN) 5-325 MG tablet Take 1-2 tablets by mouth every 4 (four) hours as needed for moderate pain., Starting 08/21/2015, Until Discontinued, Print          Note:  This document was prepared using Dragon voice recognition software and may include unintentional dictation errors.   Hinda Kehr, MD 08/21/15 [phone]

## 2015-08-21 NOTE — Discharge Instructions (Signed)
You have been seen in the Emergency Department (ED) today and diagnosed with a deep vein thrombosis (DVT), which is a blood clot in one of your veins.  As we discussed, we are starting you on a blood thinner.  Though these carry with them the risk of bleeding, the risks of not treating your DVT (stroke, a clot in your lungs, etc.) are greater than the risk of taking the medication.  Please follow up with the doctor listed in these papers as recommended regarding today's ED visit.      Return to the ED if you have worsening pain, ANY trouble breathing, chest pain, or other new or worsening symptoms that concern you.   Deep Vein Thrombosis A deep vein thrombosis (DVT) is a blood clot (thrombus) that usually occurs in a deep, larger vein of the lower leg or the pelvis, or in an upper extremity such as the arm. These are dangerous and can lead to serious and even life-threatening complications if the clot travels to the lungs. A DVT can damage the valves in your leg veins so that instead of flowing upward, the blood pools in the lower leg. This is called post-thrombotic syndrome, and it can result in pain, swelling, discoloration, and sores on the leg. CAUSES A DVT is caused by the formation of a blood clot in your leg, pelvis, or arm. Usually, several things contribute to the formation of blood clots. A clot may develop when:  Your blood flow slows down.  Your vein becomes damaged in some way.  You have a condition that makes your blood clot more easily. RISK FACTORS A DVT is more likely to develop in:  People who are older, especially over 37 years of age.  People who are overweight (obese).  People who sit or lie still for a long time, such as during long-distance travel (over 4 hours), bed rest, hospitalization, or during recovery from certain medical conditions like a stroke.  People who do not engage in much physical activity (sedentary lifestyle).  People who have chronic breathing  disorders.  People who have a personal or family history of blood clots or blood clotting disease.  People who have peripheral vascular disease (PVD), diabetes, or some types of cancer.  People who have heart disease, especially if the person had a recent heart attack or has congestive heart failure.  People who have neurological diseases that affect the legs (leg paresis).  People who have had a traumatic injury, such as breaking a hip or leg.  People who have recently had major or lengthy surgery, especially on the hip, knee, or abdomen.  People who have had a central line placed inside a large vein.  People who take medicines that contain the hormone estrogen. These include birth control pills and hormone replacement therapy.  Pregnancy or during childbirth or the postpartum period.  Long plane flights (over 8 hours). SIGNS AND SYMPTOMS Symptoms of a DVT can include:   Swelling of your leg or arm, especially if one side is much worse.  Warmth and redness of your leg or arm, especially if one side is much worse.  Pain in your arm or leg. If the clot is in your leg, symptoms may be more noticeable or worse when you stand or walk.  A feeling of pins and needles, if the clot is in the arm. The symptoms of a DVT that has traveled to the lungs (pulmonary embolism, PE) usually start suddenly and include:  Shortness of breath  while active or at rest.  Coughing or coughing up blood or blood-tinged mucus.  Chest pain that is often worse with deep breaths.  Rapid or irregular heartbeat.  Feeling light-headed or dizzy.  Fainting.  Feeling anxious.  Sweating. There may also be pain and swelling in a leg if that is where the blood clot started. These symptoms may represent a serious problem that is an emergency. Do not wait to see if the symptoms will go away. Get medical help right away. Call your local emergency services (911 in the [place] Do not drive yourself to the  hospital. DIAGNOSIS Your health care provider will take a medical history and perform a physical exam. You may also have other tests, including:  Blood tests to assess the clotting properties of your blood.  Imaging tests, such as CT, ultrasound, MRI, X-ray, and other tests to see if you have clots anywhere in your body. TREATMENT After a DVT is identified, it can be treated. The type of treatment that you receive depends on many factors, such as the cause of your DVT, your risk for bleeding or developing more clots, and other medical conditions that you have. Sometimes, a combination of treatments is necessary. Treatment options may be combined and include:  Monitoring the blood clot with ultrasound.  Taking medicines by mouth, such as newer blood thinners (anticoagulants), thrombolytics, or warfarin.  Taking anticoagulant medicine by injection or through an IV tube.  Wearing compression stockings or using different types ofdevices.  Surgery (rare) to remove the blood clot or to place a filter in your abdomen to stop the blood clot from traveling to your lungs. Treatments for a DVT are often divided into immediate treatment and long-term treatment (up to 3 months after DVT). You can work with your health care provider to choose the treatment program that is best for you. HOME CARE INSTRUCTIONS If you are taking a newer oral anticoagulant:  Take the medicine every single day at the same time each day.  Understand what foods and drugs interact with this medicine.  Understand that there are no regular blood tests required when using this medicine.  Understand the side effects of this medicine, including excessive bruising or bleeding. Ask your health care provider or pharmacist about other possible side effects. If you are taking warfarin:  Understand how to take warfarin and know which foods can affect how warfarin works in [employer].  Understand that it is dangerous to take too  much or too little warfarin. Too much warfarin increases the risk of bleeding. Too little warfarin continues to allow the risk for blood clots.  Follow your PT and INR blood testing schedule. The PT and INR results allow your health care provider to adjust your dose of warfarin. It is very important that you have your PT and INR tested as often as told by your health care provider.  Avoid major changes in your diet, or tell your health care provider before you change your diet. Arrange a visit with a registered dietitian to answer your questions. Many foods, especially foods that are high in vitamin K, can interfere with warfarin and affect the PT and INR results. Eat a consistent amount of foods that are high in vitamin K, such as:  Spinach, kale, broccoli, cabbage, collard greens, turnip greens, Brussels sprouts, peas, cauliflower, seaweed, and parsley.  Beef liver and pork liver.  Green tea.  Soybean oil.  Tell your health care provider about any and all medicines, vitamins, and  supplements that you take, including aspirin and other over-the-counter anti-inflammatory medicines. Be especially cautious with aspirin and anti-inflammatory medicines. Do not take those before you ask your health care provider if it is safe to do so. This is important because many medicines can interfere with warfarin and affect the PT and INR results.  Do not start or stop taking any over-the-counter or prescription medicine unless your health care provider or pharmacist tells you to do so. If you take warfarin, you will also need to do these things:  Hold pressure over cuts for longer than usual.  Tell your dentist and other health care providers that you are taking warfarin before you have any procedures in which bleeding may occur.  Avoid alcohol or drink very small amounts. Tell your health care provider if you change your alcohol intake.  Do not use tobacco products, including cigarettes, chewing tobacco,  and e-cigarettes. If you need help quitting, ask your health care provider.  Avoid contact sports. General Instructions  Take over-the-counter and prescription medicines only as told by your health care provider. Anticoagulant medicines can have side effects, including easy bruising and difficulty stopping bleeding. If you are prescribed an anticoagulant, you will also need to do these things:  Hold pressure over cuts for longer than usual.  Tell your dentist and other health care providers that you are taking anticoagulants before you have any procedures in which bleeding may occur.  Avoid contact sports.  Wear a medical alert bracelet or carry a medical alert card that says you have had a PE.  Ask your health care provider how soon you can go back to your normal activities. Stay active to prevent new blood clots from forming.  Make sure to exercise while traveling or when you have been sitting or standing for a long period of time. It is very important to exercise. Exercise your legs by walking or by tightening and relaxing your leg muscles often. Take frequent walks.  Wear compression stockings as told by your health care provider to help prevent more blood clots from forming.  Do not use tobacco products, including cigarettes, chewing tobacco, and e-cigarettes. If you need help quitting, ask your health care provider.  Keep all follow-up appointments with your health care provider. This is important. PREVENTION Take these actions to decrease your risk of developing another DVT:  Exercise regularly. For at least 30 minutes every day, engage in:  Activity that involves moving your arms and legs.  Activity that encourages good blood flow through your body by increasing your heart rate.  Exercise your arms and legs every hour during long-distance travel (over 4 hours). Drink plenty of water and avoid drinking alcohol while traveling.  Avoid sitting or lying in bed for long periods  of time without moving your legs.  Maintain a weight that is appropriate for your height. Ask your health care provider what weight is healthy for you.  If you are a woman who is over 62 years of age, avoid unnecessary use of medicines that contain estrogen. These include birth control pills.  Do not smoke, especially if you take estrogen medicines. If you need help quitting, ask your health care provider. If you are hospitalized, prevention measures may include:  Early walking after surgery, as soon as your health care provider says that it is safe.  Receiving anticoagulants to prevent blood clots.If you cannot take anticoagulants, other options may be available, such as wearing compression stockings or using different types of devices. SEEK  IMMEDIATE MEDICAL CARE IF:  You have new or increased pain, swelling, or redness in an arm or leg.  You have numbness or tingling in an arm or leg.  You have shortness of breath while active or at rest.  You have chest pain.  You have a rapid or irregular heartbeat.  You feel light-headed or dizzy.  You cough up blood.  You notice blood in your vomit, bowel movement, or urine. These symptoms may represent a serious problem that is an emergency. Do not wait to see if the symptoms will go away. Get medical help right away. Call your local emergency services (911 in the [place] Do not drive yourself to the hospital.   This information is not intended to replace advice given to you by your health care provider. Make sure you discuss any questions you have with your health care provider.   Document Released: 03/07/2005 Document Revised: 11/26/2014 Document Reviewed: 07/02/2014 Elsevier Interactive Patient Education [employer]2016 [employer]

## 2015-08-21 NOTE — ED Notes (Signed)
Spoke with Dr. Shaune PollackLord re: patient. At this time no need to draw labs or do any extra testing until further eval by an MD.

## 2015-09-01 ENCOUNTER — Ambulatory Visit: Payer: BC Managed Care – PPO | Admitting: Internal Medicine

## 2015-09-01 ENCOUNTER — Encounter: Payer: Self-pay | Admitting: Internal Medicine

## 2015-09-01 ENCOUNTER — Inpatient Hospital Stay: Payer: BC Managed Care – PPO | Attending: Internal Medicine | Admitting: Internal Medicine

## 2015-09-01 VITALS — BP 110/76 | HR 120 | Temp 99.5°F | Resp 18 | Ht 66.14 in | Wt 140.7 lb

## 2015-09-01 DIAGNOSIS — Z7901 Long term (current) use of anticoagulants: Secondary | ICD-10-CM | POA: Insufficient documentation

## 2015-09-01 DIAGNOSIS — I7300 Raynaud's syndrome without gangrene: Secondary | ICD-10-CM

## 2015-09-01 DIAGNOSIS — I82412 Acute embolism and thrombosis of left femoral vein: Secondary | ICD-10-CM | POA: Insufficient documentation

## 2015-09-01 DIAGNOSIS — Z79899 Other long term (current) drug therapy: Secondary | ICD-10-CM | POA: Diagnosis not present

## 2015-09-01 DIAGNOSIS — D6851 Activated protein C resistance: Secondary | ICD-10-CM

## 2015-09-01 DIAGNOSIS — K582 Mixed irritable bowel syndrome: Secondary | ICD-10-CM | POA: Insufficient documentation

## 2015-09-01 DIAGNOSIS — I82409 Acute embolism and thrombosis of unspecified deep veins of unspecified lower extremity: Secondary | ICD-10-CM | POA: Insufficient documentation

## 2015-09-01 NOTE — Progress Notes (Signed)
Patient here for new evaluation regarding DVT left leg, has hx IBS, no other complaints

## 2015-09-01 NOTE — Progress Notes (Signed)
[facility] CONSULT NOTE  Patient Care Team: Rolm Gala, MD as PCP - General (Family Medicine)  Dr.Ann Carmon Sails; GI [facility]; [place].  Dr. Delton See.   CHIEF COMPLAINTS/PURPOSE OF CONSULTATION:   # June 2017- DVT of LLE [mid- femoral Vein thru distal thigh;popliteal/calf veins] on Eliquis  # Factor V leiden Heterozygous-   HISTORY OF PRESENTING ILLNESS:  Miguel Rodgers 25 y.o.  male with a history of heterozygous factor V Leiden mutation/ found on screening- presented to the emergency room in early June 2017 with left lower extremity pain. Ultrasound showed DVT of the left lower extremity in the mid femoral vein- with occlusive thrombus to the distal thigh popliteal/calf veins. Patient was started on  Eliquis. Patient previously did not have any blood clots.  Patient is tolerating anticoagulant very well. He notes to have significant improvement of his left calf pain the last few days. Denies any worsening swelling. Denies any bleeding.   Patient denies any long flights/ or car rides. Patient admits to sitting on the commode-mostly for diarrhea "hours". He attributes to diarrhea/constipation to IBS. He is being evaluated [facility] with planned upper and lower endoscopies/manometry studies. Patient's mother diagnosed with factor V Leiden heterozygous.   ROS: A complete 10 point review of system is done which is negative except mentioned above in history of present illness  MEDICAL HISTORY:  Past Medical History  Diagnosis Date  . Allergic rhinitis, seasonal 01/07/2015  . Heterozygous factor V Leiden mutation (HCC) 01/07/2015  . Raynaud's syndrome   . Asthma     when seasonal allergies are severe  . Motion sickness     all moving vehicles  . Allergy     SURGICAL HISTORY: Past Surgical History  Procedure Laterality Date  . No past surgeries    . Colonoscopy with propofol N/A 05/01/2015    Procedure: COLONOSCOPY WITH PROPOFOL;  Surgeon: Midge Minium, MD;  Location: [facility];  Service: Endoscopy;  Laterality: N/A;    SOCIAL HISTORY: Social History   Social History  . Marital Status: Single    Spouse Name: N/A  . Number of Children: N/A  . Years of Education: N/A   Occupational History  . Not on file.   Social History Main Topics  . Smoking status: Never Smoker   . Smokeless tobacco: Never Used  . Alcohol Use: No     Comment: occasional   . Drug Use: No  . Sexual Activity: Not on file   Other Topics Concern  . Not on file   Social History Narrative    FAMILY HISTORY: Family History  Problem Relation Age of Onset  . Factor V Leiden deficiency Mother   . Lupus Mother   . Sleep apnea Mother   . Prostate cancer Maternal Grandfather     ALLERGIES:  is allergic to other and pollen extract.  MEDICATIONS:  Current Outpatient Prescriptions  Medication Sig Dispense Refill  . apixaban (ELIQUIS) 5 MG TABS tablet Take 2 tablets (10 mg) by mouth twice daily for 7 days.  Then take 1 tablet (5 mg) by mouth twice daily. 74 tablet 1  . cetirizine (ZYRTEC) 10 MG tablet Take 10 mg by mouth daily.    Marland Kitchen desipramine (NOPRAMIN) 10 MG tablet     . dicyclomine (BENTYL) 20 MG tablet Take by mouth. Reported on 07/01/2015    . diphenhydrAMINE (BENADRYL) 25 mg capsule Take by mouth.    . Loperamide HCl (IMODIUM PO) Take by mouth as needed.    Marland Kitchen  montelukast (SINGULAIR) 10 MG tablet     . simethicone (MYLICON) 80 MG chewable tablet Chew 80 mg by mouth every 6 (six) hours as needed for flatulence. Reported on 07/01/2015     No current facility-administered medications for this visit.      Marland Kitchen.  PHYSICAL EXAMINATION: ECOG PERFORMANCE STATUS: 0 - Asymptomatic  Filed Vitals:   09/01/15 1446  BP: 110/76  Pulse: 120  Temp: 99.5 F (37.5 C)  Resp: 18   Filed Weights   09/01/15 1446  Weight: 140 lb 10.5 oz (63.8 kg)    GENERAL: Well-nourished well-developed; Alert, no distress and comfortable.   Accompanied by his mother.  EYES: no pallor or  icterus OROPHARYNX: no thrush or ulceration; good dentition  NECK: supple, no masses felt LYMPH:  no palpable lymphadenopathy in the cervical, axillary or inguinal regions LUNGS: clear to auscultation and  No wheeze or crackles HEART/CVS: regular rate & rhythm and no murmurs; no significant swelling noted of the left lower extremity. ABDOMEN: abdomen soft, non-tender and normal bowel sounds Musculoskeletal:no cyanosis of digits and no clubbing  PSYCH: alert & oriented x 3 with fluent speech NEURO: no focal motor/sensory deficits SKIN:  no rashes or significant lesions  LABORATORY DATA:  I have reviewed the data as listed Lab Results  Component Value Date   WBC 13.0* 08/21/2015   HGB 16.0 08/21/2015   HCT 46.4 08/21/2015   MCV 83.6 08/21/2015   PLT 169 08/21/2015    Recent Labs  08/21/15 1928  NA 138  K 3.9  CL 101  CO2 25  GLUCOSE 94  BUN 12  CREATININE 0.87  CALCIUM 9.9  GFRNONAA >60  GFRAA >60    RADIOGRAPHIC STUDIES: I have personally reviewed the radiological images as listed and agreed with the findings in the report. [place] Venous Img Lower Unilateral Left  08/21/2015  CLINICAL DATA:  Left calf pain and edema. History of factor 5 Leiden. EXAM: LEFT LOWER EXTREMITY VENOUS DOPPLER ULTRASOUND TECHNIQUE: Gray-scale sonography with graded compression, as well as color Doppler and duplex ultrasound were performed to evaluate the lower extremity deep venous systems from the level of the common femoral vein and including the common femoral, femoral, profunda femoral, popliteal and calf veins including the posterior tibial, peroneal and gastrocnemius veins when visible. The superficial great saphenous vein was also interrogated. Spectral Doppler was utilized to evaluate flow at rest and with distal augmentation maneuvers in the common femoral, femoral and popliteal veins. COMPARISON:  None. FINDINGS: Contralateral Common Femoral Vein: Respiratory phasicity is normal and symmetric  with the symptomatic side. No evidence of thrombus. Normal compressibility. Common Femoral Vein: No evidence of thrombus. Normal compressibility, respiratory phasicity and response to augmentation. Saphenofemoral Junction: No evidence of thrombus. Normal compressibility and flow on color Doppler imaging. Profunda Femoral Vein: No evidence of thrombus. Normal compressibility, respiratory phasicity and response to augmentation. Femoral Vein: Beginning in the mid thigh, there is evidence of DVT within the femoral vein which is nonocclusive in the mid thigh and becomes occlusive in the distal thigh. Popliteal Vein: Occlusive thrombus identified throughout the popliteal vein. Calf Veins: Thrombus extends into visualized peroneal and posterior tibial veins. Superficial Great Saphenous Vein: No evidence of thrombus. Normal compressibility and flow on color Doppler imaging. Venous Reflux:  None. Other Findings: No evidence of superficial thrombophlebitis or abnormal fluid collection. IMPRESSION: The exam is positive for DVT of the left lower extremity beginning in the femoral vein at the mid thigh level. Occlusive thrombus is present  in the distal thigh, popliteal vein and visualized calf veins. Electronically Signed   By: Irish Lack M.D.   On: 08/21/2015 16:07    ASSESSMENT & PLAN:   # Left lower extremity DVT/factor V Leiden heterozygous- on Eliqius; no clear provoking/precipitating event [ Question sitting on commode for hours]. Discussed with patient that in general we would recommend at least 6 months of anticoagulation. Clinically patient seems to be improving on current anticoagulation. No role for any thrombolytic therapy.   # Long discussion regarding absence of any reversible agents for his Eliquis; importance of avoiding falls trauma. He agrees.  # With regards to planned upper and lower endoscopy- I would recommend holding off invasive procedures needing biopsy for at least 3 months [unless the  procedures are urgent/emergent]. Manometry study- should be feasible given the patient will be on anticoagulation.   # Patient will follow-up with me with labs CBC CMP in approximately 3 months/ to review the timing of above endoscopic procedures.  All questions were answered. The patient knows to call the clinic with any problems, questions or concerns.  Thank you for allowing me to participate in the care of your pleasant patient. Please do not hesitate to contact me with questions or concerns in the interim.  # 45 minutes face-to-face with the patient discussing the above plan of care; more than 50% of time spent on prognosis/ natural history; counseling and coordination.     Earna Coder, MD 09/01/2015 2:54 PM

## 2015-09-08 ENCOUNTER — Ambulatory Visit: Payer: BC Managed Care – PPO | Admitting: Internal Medicine

## 2015-10-26 ENCOUNTER — Other Ambulatory Visit: Payer: Self-pay | Admitting: *Deleted

## 2015-10-26 MED ORDER — APIXABAN 5 MG PO TABS: ORAL_TABLET | ORAL | 1 refills | Status: DC

## 2015-12-04 ENCOUNTER — Other Ambulatory Visit: Payer: Self-pay

## 2015-12-04 DIAGNOSIS — D6851 Activated protein C resistance: Secondary | ICD-10-CM

## 2015-12-08 ENCOUNTER — Inpatient Hospital Stay: Payer: BC Managed Care – PPO

## 2015-12-08 ENCOUNTER — Inpatient Hospital Stay: Payer: BC Managed Care – PPO | Attending: Internal Medicine | Admitting: Internal Medicine

## 2015-12-08 DIAGNOSIS — Z86718 Personal history of other venous thrombosis and embolism: Secondary | ICD-10-CM

## 2015-12-08 DIAGNOSIS — D6851 Activated protein C resistance: Secondary | ICD-10-CM

## 2015-12-08 DIAGNOSIS — I7300 Raynaud's syndrome without gangrene: Secondary | ICD-10-CM | POA: Diagnosis not present

## 2015-12-08 DIAGNOSIS — Z7901 Long term (current) use of anticoagulants: Secondary | ICD-10-CM | POA: Diagnosis not present

## 2015-12-08 DIAGNOSIS — K639 Disease of intestine, unspecified: Secondary | ICD-10-CM

## 2015-12-08 DIAGNOSIS — I82412 Acute embolism and thrombosis of left femoral vein: Secondary | ICD-10-CM | POA: Insufficient documentation

## 2015-12-08 DIAGNOSIS — I82411 Acute embolism and thrombosis of right femoral vein: Secondary | ICD-10-CM | POA: Diagnosis present

## 2015-12-08 DIAGNOSIS — Z79899 Other long term (current) drug therapy: Secondary | ICD-10-CM | POA: Diagnosis not present

## 2015-12-08 LAB — CBC WITH DIFFERENTIAL/PLATELET
BASOS ABS: 0 10*3/uL (ref 0–0.1)
Basophils Relative: 0 %
EOS PCT: 1 %
Eosinophils Absolute: 0 10*3/uL (ref 0–0.7)
HCT: 48.1 % (ref 40.0–52.0)
Hemoglobin: 16.8 g/dL (ref 13.0–18.0)
LYMPHS PCT: 29 %
Lymphs Abs: 1.4 10*3/uL (ref 1.0–3.6)
MCH: 28.9 pg (ref 26.0–34.0)
MCHC: 35 g/dL (ref 32.0–36.0)
MCV: 82.6 fL (ref 80.0–100.0)
MONO ABS: 0.5 10*3/uL (ref 0.2–1.0)
Monocytes Relative: 10 %
Neutro Abs: 2.9 10*3/uL (ref 1.4–6.5)
Neutrophils Relative %: 61 %
PLATELETS: 187 10*3/uL (ref 150–440)
RBC: 5.83 MIL/uL (ref 4.40–5.90)
RDW: 13.5 % (ref 11.5–14.5)
WBC: 4.8 10*3/uL (ref 3.8–10.6)

## 2015-12-08 LAB — COMPREHENSIVE METABOLIC PANEL
ALT: 22 U/L (ref 17–63)
ANION GAP: 8 (ref 5–15)
AST: 19 U/L (ref 15–41)
Albumin: 5.2 g/dL — ABNORMAL HIGH (ref 3.5–5.0)
Alkaline Phosphatase: 52 U/L (ref 38–126)
BUN: 11 mg/dL (ref 6–20)
CHLORIDE: 103 mmol/L (ref 101–111)
CO2: 28 mmol/L (ref 22–32)
Calcium: 9.5 mg/dL (ref 8.9–10.3)
Creatinine, Ser: 0.81 mg/dL (ref 0.61–1.24)
Glucose, Bld: 97 mg/dL (ref 65–99)
POTASSIUM: 4.2 mmol/L (ref 3.5–5.1)
Sodium: 139 mmol/L (ref 135–145)
Total Bilirubin: 0.8 mg/dL (ref 0.3–1.2)
Total Protein: 8 g/dL (ref 6.5–8.1)

## 2015-12-08 NOTE — Assessment & Plan Note (Addendum)
#   Left lower extremity DVT/factor V Leiden heterozygous- on Eliqius; no clear provoking/precipitating event [ Question sitting on commode for hours]. Patient tolerating anticoagulation well. Patient has been on anticoagulation for 3 months. I would recommend a total of 6 months of anticoagulation therapy.  # Possible inflammatory bowel disease- patient is awaiting to have EGD colonoscopy. I think it's reasonable to hold anticoagulation peri-procedural. Okay to hold anticoagulation 3 days prior to procedure; and start back the next day at the discretion of the GI physician/based upon procedure.   # Will fax a form to patient's GI with above recommendations.  # Otherwise the patient will follow-up with me in approximately 3 months. Also discussed working up for antiphospholipid antibody syndrome after patient is off anticoagulation.

## 2015-12-08 NOTE — Progress Notes (Signed)
[facility] CON[facility]  Patient Care Team: Rolm GalaHeidi Grandis, MD as PCP - General (Family Medicine)  Dr.Ann Carmon SailsPeery; GI [facility]; [place].  Dr. Delton SeeNelson.   CHIEF COMPLAINTS/PURPOSE OF CONSULTATION:   # June 2017- DVT of LLE [mid- femoral Vein thru distal thigh;popliteal/calf veins] on Eliquis  # ? IBD [[facility]-; GI ]  # Factor V leiden Heterozygous-   HISTORY OF PRESENTING ILLNESS:  Miguel Rodgers 25 y.o.  male with a history of heterozygous factor V Leiden mutation; and a history of possible inflammatory bowel disease and a history of left lower extremity DVT mid femoral vein/calf veins- on Eliquis is here for follow-up.  Patient denies any new clots. Patient denies any significant worsening of his leg swelling. He has intermittent pain in his leg otherwise denies any significant worsening. He continues to be on Eliquis.   In the interim patient had a rectal manometry done. He is awaiting to have a EGD colonoscopy- for further evaluation of his possible inflammatory disease.    ROS: A complete 10 point review of system is done which is negative except mentioned above in history of present illness  MEDICAL HISTORY:  Past Medical History:  Diagnosis Date  . Allergic rhinitis, seasonal 01/07/2015  . Allergy   . Asthma    when seasonal allergies are severe  . Heterozygous factor V Leiden mutation (HCC) 01/07/2015  . Motion sickness    all moving vehicles  . Raynaud's syndrome     SURGICAL HISTORY: Past Surgical History:  Procedure Laterality Date  . COLONOSCOPY WITH PROPOFOL N/A 05/01/2015   Procedure: COLONOSCOPY WITH PROPOFOL;  Surgeon: Midge Miniumarren Wohl, MD;  Location: [facility];  Service: Endoscopy;  Laterality: N/A;  . NO PAST SURGERIES      SOCIAL HISTORY: Social History   Social History  . Marital status: Single    Spouse name: N/A  . Number of children: N/A  . Years of education: N/A   Occupational History  . Not on file.   Social History Main Topics   . Smoking status: Never Smoker  . Smokeless tobacco: Never Used  . Alcohol use No     Comment: occasional   . Drug use: No  . Sexual activity: Not on file   Other Topics Concern  . Not on file   Social History Narrative  . No narrative on file    FAMILY HISTORY: Family History  Problem Relation Age of Onset  . Factor V Leiden deficiency Mother   . Lupus Mother   . Sleep apnea Mother   . Prostate cancer Maternal Grandfather     ALLERGIES:  is allergic to other and pollen extract.  MEDICATIONS:  Current Outpatient Prescriptions  Medication Sig Dispense Refill  . apixaban (ELIQUIS) 5 MG TABS tablet Take 1 tablet (5 mg) by mouth twice daily. 60 tablet 1  . desipramine (NOPRAMIN) 10 MG tablet Take 4 tablets daily for total of 40mg     . diphenhydrAMINE (BENADRYL) 25 mg capsule Take by mouth.    . Loperamide HCl (IMODIUM PO) Take by mouth as needed.    . montelukast (SINGULAIR) 10 MG tablet     . simethicone (MYLICON) 80 MG chewable tablet Chew 80 mg by mouth every 6 (six) hours as needed for flatulence. Reported on 07/01/2015    . cetirizine (ZYRTEC) 10 MG tablet Take 10 mg by mouth daily.    Marland Kitchen. dicyclomine (BENTYL) 20 MG tablet Take by mouth. Reported on 07/01/2015     No current  facility-administered medications for this visit.       Marland Kitchen  PHYSICAL EXAMINATION: ECOG PERFORMANCE STATUS: 0 - Asymptomatic  Vitals:   12/08/15 1438  BP: 123/81  Pulse: (!) 120  Resp: 20  Temp: 98.2 F (36.8 C)   Filed Weights   12/08/15 1438  Weight: 144 lb 2.9 oz (65.4 kg)    GENERAL: Well-nourished well-developed; Alert, no distress and comfortable.   He is alone.  EYES: no pallor or icterus OROPHARYNX: no thrush or ulceration; good dentition  NECK: supple, no masses felt LYMPH:  no palpable lymphadenopathy in the cervical, axillary or inguinal regions LUNGS: clear to auscultation and  No wheeze or crackles HEART/CVS: regular rate & rhythm and no murmurs; no significant swelling  noted of the left lower extremity. ABDOMEN: abdomen soft, non-tender and normal bowel sounds Musculoskeletal:no cyanosis of digits and no clubbing  PSYCH: alert & oriented x 3 with fluent speech NEURO: no focal motor/sensory deficits SKIN:  no rashes or significant lesions  LABORATORY DATA:  I have reviewed the data as listed Lab Results  Component Value Date   WBC 13.0 (H) 08/21/2015   HGB 16.0 08/21/2015   HCT 46.4 08/21/2015   MCV 83.6 08/21/2015   PLT 169 08/21/2015    Recent Labs  08/21/15 1928 12/08/15 0910  NA 138 139  K 3.9 4.2  CL 101 103  CO2 25 28  GLUCOSE 94 97  BUN 12 11  CREATININE 0.87 0.81  CALCIUM 9.9 9.5  GFRNONAA >60 >60  GFRAA >60 >60  PROT  --  8.0  ALBUMIN  --  5.2*  AST  --  19  ALT  --  22  ALKPHOS  --  52  BILITOT  --  0.8    RADIOGRAPHIC STUDIES: I have personally reviewed the radiological images as listed and agreed with the findings in the report. No results found.  ASSESSMENT & PLAN:   Acute deep vein thrombosis (DVT) of femoral vein of right lower extremity (HCC) # Left lower extremity DVT/factor V Leiden heterozygous- on Eliqius; no clear provoking/precipitating event [ Question sitting on commode for hours]. Patient tolerating anticoagulation well. Patient has been on anticoagulation for 3 months. I would recommend a total of 6 months of anticoagulation therapy.  # Possible inflammatory bowel disease- patient is awaiting to have EGD colonoscopy. I think it's reasonable to hold anticoagulation peri-procedural. Okay to hold anticoagulation 3 days prior to procedure; and start back the next day at the discretion of the GI physician/based upon procedure.   # Will fax a form to patient's GI with above recommendations.  # Otherwise the patient will follow-up with me in approximately 3 months. Also discussed working up for antiphospholipid antibody syndrome after patient is off anticoagulation.  Earna Coder,  MD 12/08/2015 7:50 PM

## 2015-12-08 NOTE — Progress Notes (Signed)
Patient here for anticoagulation therapy. Wants to talk to MD regarding blood workup for lupus. [facility] Tereso NewcomerAnn Peery for possible Crohns Disease. Bloating, gas, diarrhea, cramping and abd pain. Patient also feels lighheaded ,weak .

## 2015-12-11 ENCOUNTER — Encounter: Payer: Self-pay | Admitting: Internal Medicine

## 2015-12-15 ENCOUNTER — Other Ambulatory Visit: Payer: Self-pay | Admitting: Internal Medicine

## 2016-01-17 ENCOUNTER — Other Ambulatory Visit: Payer: Self-pay | Admitting: Internal Medicine

## 2016-02-23 ENCOUNTER — Inpatient Hospital Stay: Payer: BC Managed Care – PPO | Attending: Internal Medicine | Admitting: Internal Medicine

## 2016-02-23 VITALS — BP 120/79 | HR 111 | Temp 99.2°F | Wt 133.0 lb

## 2016-02-23 DIAGNOSIS — I7300 Raynaud's syndrome without gangrene: Secondary | ICD-10-CM | POA: Diagnosis not present

## 2016-02-23 DIAGNOSIS — Z79899 Other long term (current) drug therapy: Secondary | ICD-10-CM

## 2016-02-23 DIAGNOSIS — Z8042 Family history of malignant neoplasm of prostate: Secondary | ICD-10-CM

## 2016-02-23 DIAGNOSIS — Z86718 Personal history of other venous thrombosis and embolism: Secondary | ICD-10-CM | POA: Diagnosis not present

## 2016-02-23 DIAGNOSIS — D6851 Activated protein C resistance: Secondary | ICD-10-CM

## 2016-02-23 DIAGNOSIS — I82411 Acute embolism and thrombosis of right femoral vein: Secondary | ICD-10-CM | POA: Diagnosis present

## 2016-02-23 DIAGNOSIS — J45909 Unspecified asthma, uncomplicated: Secondary | ICD-10-CM | POA: Diagnosis not present

## 2016-02-23 DIAGNOSIS — K589 Irritable bowel syndrome without diarrhea: Secondary | ICD-10-CM | POA: Diagnosis not present

## 2016-02-23 DIAGNOSIS — Z7901 Long term (current) use of anticoagulants: Secondary | ICD-10-CM

## 2016-02-23 NOTE — Assessment & Plan Note (Signed)
#   Left lower extremity DVT/factor V Leiden heterozygous- on Eliqius; no clear provoking/precipitating event [ Question sitting on commode for hours]. Patient tolerating anticoagulation well- patient will finish 6 months of anticoagulation mid- of December/this month.   # IBD- awaiting repeat endoscopy end of December 2017/after stopping anticoagulation  # follow up in 6 months/ no labs.

## 2016-02-23 NOTE — Progress Notes (Signed)
Patient here today for 6 mth follow up from DVT

## 2016-02-23 NOTE — Progress Notes (Signed)
[facility] CONSULT NOTE  Patient Care Team: Rolm GalaHeidi Grandis, MD as PCP - General (Family Medicine)  Dr.Ann Carmon SailsPeery; GI [facility]; [place].  Dr. Delton SeeNelson.   CHIEF COMPLAINTS/PURPOSE OF CONSULTATION:   # June 2017- DVT of LLE [mid- femoral Vein thru distal thigh;popliteal/calf veins] on Eliquis; plan stopping mid- Dec 2017.  # ? IBD [[facility]-; GI ]  # Factor V leiden Heterozygous   HISTORY OF PRESENTING ILLNESS:  Miguel Rodgers 25 y.o.  male with a history of heterozygous factor V Leiden mutation; and a history of possible inflammatory bowel disease and a history of left lower extremity DVT mid femoral vein/calf veins- on Eliquis is here for follow-up.  Patient is awaiting a repeat endoscopy with GI end of December 2017. Is currently wearing stockings.    Patient denies any new clots. Patient denies any significant worsening of his leg swelling. He has intermittent pain in his leg otherwise denies any significant worsening. He continues to be on Eliquis.    ROS: A complete 10 point review of system is done which is negative except mentioned above in history of present illness  MEDICAL HISTORY:  Past Medical History:  Diagnosis Date  . Allergic rhinitis, seasonal 01/07/2015  . Allergy   . Asthma    when seasonal allergies are severe  . Heterozygous factor V Leiden mutation (HCC) 01/07/2015  . Motion sickness    all moving vehicles  . Raynaud's syndrome     SURGICAL HISTORY: Past Surgical History:  Procedure Laterality Date  . COLONOSCOPY WITH PROPOFOL N/A 05/01/2015   Procedure: COLONOSCOPY WITH PROPOFOL;  Surgeon: Midge Miniumarren Wohl, MD;  [facility];  Service: Endoscopy;  Laterality: N/A;  . NO PAST SURGERIES      SOCIAL HISTORY: Social History   Social History  . Marital status: Single    Spouse name: N/A  . Number of children: N/A  . Years of education: N/A   Occupational History  . Not on file.   Social History Main Topics  . Smoking status:  Never Smoker  . Smokeless tobacco: Never Used  . Alcohol use No     Comment: occasional   . Drug use: No  . Sexual activity: Not on file   Other Topics Concern  . Not on file   Social History Narrative  . No narrative on file    FAMILY HISTORY: Family History  Problem Relation Age of Onset  . Factor V Leiden deficiency Mother   . Lupus Mother   . Sleep apnea Mother   . Prostate cancer Maternal Grandfather     ALLERGIES:  is allergic to citalopram; other; and pollen extract.  MEDICATIONS:  Current Outpatient Prescriptions  Medication Sig Dispense Refill  . cetirizine (ZYRTEC) 10 MG tablet Take 10 mg by mouth daily.    Marland Kitchen. desipramine (NOPRAMIN) 10 MG tablet Take 4 tablets daily for total of 40mg     . diphenhydrAMINE (BENADRYL) 25 mg capsule Take by mouth.    Everlene Balls. ELIQUIS 5 MG TABS tablet TAKE 1 TABLET(5 MG) BY MOUTH TWICE DAILY 60 tablet 0  . Loperamide HCl (IMODIUM PO) Take by mouth as needed.    . montelukast (SINGULAIR) 10 MG tablet     . simethicone (MYLICON) 80 MG chewable tablet Chew 80 mg by mouth every 6 (six) hours as needed for flatulence. Reported on 07/01/2015     No current facility-administered medications for this visit.       Marland Kitchen.  PHYSICAL EXAMINATION: ECOG PERFORMANCE STATUS: 0 -  Asymptomatic  Vitals:   02/23/16 1352  BP: 120/79  Pulse: (!) 111  Temp: 99.2 F (37.3 C)   Filed Weights   02/23/16 1352  Weight: 133 lb 0.8 oz (60.3 kg)    GENERAL: Well-nourished well-developed; Alert, no distress and comfortable.   He is alone.  EYES: no pallor or icterus OROPHARYNX: no thrush or ulceration; good dentition  NECK: supple, no masses felt LYMPH:  no palpable lymphadenopathy in the cervical, axillary or inguinal regions LUNGS: clear to auscultation and  No wheeze or crackles HEART/CVS: regular rate & rhythm and no murmurs; no significant swelling noted of the left lower extremity. ABDOMEN: abdomen soft, non-tender and normal bowel  sounds Musculoskeletal:no cyanosis of digits and no clubbing  PSYCH: alert & oriented x 3 with fluent speech NEURO: no focal motor/sensory deficits SKIN:  no rashes or significant lesions  LABORATORY DATA:  I have reviewed the data as listed Lab Results  Component Value Date   WBC 4.8 12/08/2015   HGB 16.8 12/08/2015   HCT 48.1 12/08/2015   MCV 82.6 12/08/2015   PLT 187 12/08/2015    Recent Labs  08/21/15 1928 12/08/15 0910  NA 138 139  K 3.9 4.2  CL 101 103  CO2 25 28  GLUCOSE 94 97  BUN 12 11  CREATININE 0.87 0.81  CALCIUM 9.9 9.5  GFRNONAA >60 >60  GFRAA >60 >60  PROT  --  8.0  ALBUMIN  --  5.2*  AST  --  19  ALT  --  22  ALKPHOS  --  52  BILITOT  --  0.8    RADIOGRAPHIC STUDIES: I have personally reviewed the radiological images as listed and agreed with the findings in the report. No results found.  ASSESSMENT & PLAN:   Acute deep vein thrombosis (DVT) of femoral vein of right lower extremity (HCC) # Left lower extremity DVT/factor V Leiden heterozygous- on Eliqius; no clear provoking/precipitating event [ Question sitting on commode for hours]. Patient tolerating anticoagulation well- patient will finish 6 months of anticoagulation mid- of December/this month.   # IBD- awaiting repeat endoscopy end of December 2017/after stopping anticoagulation  # follow up in 6 months/ no labs.     Earna CoderGovinda R Adonus Uselman, MD 02/23/2016 2:51 PM

## 2016-03-08 ENCOUNTER — Ambulatory Visit: Payer: BC Managed Care – PPO | Admitting: Internal Medicine

## 2016-04-20 NOTE — Progress Notes (Signed)
[facility]  [phone] [phone]  ID: Miguel Rodgers OB: [birth year]  MR#: [id number]  ION#:[id number]#:[id number]  Patient Care Team: Rolm GalaHeidi Grandis, MD as PCP - General (Family Medicine)  CHIEF COMPLAINT: DVT of left femoral vein, heterozygous Factor V Leiden  INTERVAL HISTORY: Patient is a 26 year old male who had an unprovoked extensive left femoral DVT in June 2017. 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 He recently completed 6 months of anticoagulation and has since discontinued treatment. He requested a second opinion since I have also taken care of his sister with factor V Leiden. He is anxious, but otherwise feels well. He has intermittent pain in his right leg. Left leg pain at the site of the DVT is essentially resolved. He has no neurologic complaints. He denies any recent fevers. He is a good appetite and denies weight loss. He denies any easy bleeding or bruising. He denies any chest pain or shortness of breath.  He has intermittent bloody diarrhea and is actively being worked up for colitis. Patient offers no further specific complaints.  REVIEW OF SYSTEMS:   Review of Systems  Constitutional: Negative.  Negative for fever, malaise/fatigue and weight loss.  Respiratory: Negative.  Negative for cough and shortness of breath.   Cardiovascular: Negative.  Negative for chest pain and leg swelling.  Gastrointestinal: Positive for blood in stool and diarrhea. Negative for abdominal pain.  Genitourinary: Negative.   Musculoskeletal: Negative.   Neurological: Negative.   Endo/Heme/Allergies: Does not bruise/bleed easily.  Psychiatric/Behavioral: The patient is nervous/anxious.     As per HPI. Otherwise, a complete review of systems is negative.  PAST MEDICAL HISTORY: Past Medical History:  Diagnosis Date  . Allergic rhinitis, seasonal 01/07/2015  . Allergy   . Asthma    when seasonal allergies are severe  . Heterozygous factor V  Leiden mutation (HCC) 01/07/2015  . Motion sickness    all moving vehicles  . Raynaud's syndrome     PAST SURGICAL HISTORY: Past Surgical History:  Procedure Laterality Date  . COLONOSCOPY WITH PROPOFOL N/A 05/01/2015   Procedure: COLONOSCOPY WITH PROPOFOL;  Surgeon: Midge Miniumarren Wohl, MD;  [facility];  Service: Endoscopy;  Laterality: N/A;  . NO PAST SURGERIES      FAMILY HISTORY: Family History  Problem Relation Age of Onset  . Factor V Leiden deficiency Mother   . Lupus Mother   . Sleep apnea Mother   . Prostate cancer Maternal Grandfather     ADVANCED DIRECTIVES (Y/N):  N  HEALTH MAINTENANCE: Social History  Substance Use Topics  . Smoking status: Never Smoker  . Smokeless tobacco: Never Used  . Alcohol use No     Comment: occasional      Colonoscopy:  PAP:  Bone density:  Lipid panel:  Allergies  Allergen Reactions  . Other Swelling    Bee Sting Bee Sting Bee Sting Bee Sting Bee Sting Bee Sting Bee Sting Bee Sting  . Citalopram Other (See Comments)    Tingling in arms and legs  . Pollen Extract Other (See Comments)    Sneezing , watery eyes , congestion    Current Outpatient Prescriptions  Medication Sig Dispense Refill  . diphenhydrAMINE (BENADRYL) 25 mg capsule Take by mouth.    Everlene Balls. ELIQUIS 5 MG TABS tablet TAKE 1 TABLET(5 MG) BY MOUTH TWICE DAILY 60 tablet 0  . fluticasone (FLONASE) 50 MCG/ACT nasal spray Place into the nose.    . cetirizine (ZYRTEC) 10 MG tablet  Take 10 mg by mouth daily.    Marland Kitchen desipramine (NOPRAMIN) 10 MG tablet Take 4 tablets daily for total of 40mg     . Loperamide HCl (IMODIUM PO) Take by mouth as needed.    . montelukast (SINGULAIR) 10 MG tablet     . simethicone (MYLICON) 80 MG chewable tablet Chew 80 mg by mouth every 6 (six) hours as needed for flatulence. Reported on 07/01/2015     No current facility-administered medications for this visit.     OBJECTIVE: Vitals:   04/22/16 1125  BP: 133/79  Pulse: 93   Temp: 98.5 F (36.9 C)     Body mass index is 21.12 kg/m.    ECOG FS:0 - Asymptomatic  General: Well-developed, well-nourished, no acute distress. Eyes: Pink conjunctiva, anicteric sclera. HEENT: Normocephalic, moist mucous membranes, clear oropharnyx. Lungs: Clear to auscultation bilaterally. Heart: Regular rate and rhythm. No rubs, murmurs, or gallops. Abdomen: Soft, nontender, nondistended. No organomegaly noted, normoactive bowel sounds. Musculoskeletal: No edema, cyanosis, or clubbing. Neuro: Alert, answering all questions appropriately. Cranial nerves grossly intact. Skin: No rashes or petechiae noted. Psych: Normal affect. Lymphatics: No cervical, calvicular, axillary or inguinal LAD.   LAB RESULTS:  Lab Results  Component Value Date   NA 139 12/08/2015   K 4.2 12/08/2015   CL 103 12/08/2015   CO2 28 12/08/2015   GLUCOSE 97 12/08/2015   BUN 11 12/08/2015   CREATININE 0.81 12/08/2015   CALCIUM 9.5 12/08/2015   PROT 8.0 12/08/2015   ALBUMIN 5.2 (H) 12/08/2015   AST 19 12/08/2015   ALT 22 12/08/2015   ALKPHOS 52 12/08/2015   BILITOT 0.8 12/08/2015   GFRNONAA >60 12/08/2015   GFRAA >60 12/08/2015    Lab Results  Component Value Date   WBC 4.8 12/08/2015   NEUTROABS 2.9 12/08/2015   HGB 16.8 12/08/2015   HCT 48.1 12/08/2015   MCV 82.6 12/08/2015   PLT 187 12/08/2015     STUDIES: No results found.  ASSESSMENT: DVT of left femoral vein, heterozygous Factor V Leiden  PLAN:    1. DVT of left femoral vein, heterozygous Factor V Leiden: Previous provider's notes and imaging reviewed extensively. Patient completed 6 months of Eliquis in December 2017. Patient expressed understanding that he has an approximately 4-8 fold risk of second DVT over the general population. He expressed understanding that if he had a second DVT, no matter the cause, would recommend lifelong treatment. We also discussed the possibility of using prophylactic Eliquis for 2-4 days for  extended trips or other transient risk factors. Although this is not necessarily in anticoagulation guidelines, it may benefit patient in the long-term to help prevent future clots and lifelong anticoagulation. Keep previously follow-up as scheduled. 2. Right leg pain: Highly unlikely secondary to DVT. Discussed the possibility of repeat ultrasound, which patient declined. He states he will call clinic if he becomes more concerned or the pain is more persistent. 3. Colitis: Patient had recent endoscopies and has follow-up with [facility] in the near future.  Approximately 30 minutes was spent in discussion of which greater than 50% was consultation.  Patient expressed understanding and was in agreement with this plan. He also understands that He can call clinic at any time with any questions, concerns, or complaints.    Jeralyn Ruths, MD   04/22/2016 2:49 PM

## 2016-04-22 ENCOUNTER — Inpatient Hospital Stay: Payer: BC Managed Care – PPO | Attending: Internal Medicine | Admitting: Oncology

## 2016-04-22 VITALS — BP 133/79 | HR 93 | Temp 98.5°F | Wt 131.4 lb

## 2016-04-22 DIAGNOSIS — F419 Anxiety disorder, unspecified: Secondary | ICD-10-CM | POA: Diagnosis not present

## 2016-04-22 DIAGNOSIS — M79604 Pain in right leg: Secondary | ICD-10-CM | POA: Diagnosis not present

## 2016-04-22 DIAGNOSIS — R197 Diarrhea, unspecified: Secondary | ICD-10-CM | POA: Diagnosis not present

## 2016-04-22 DIAGNOSIS — Z86718 Personal history of other venous thrombosis and embolism: Secondary | ICD-10-CM | POA: Diagnosis present

## 2016-04-22 DIAGNOSIS — M79605 Pain in left leg: Secondary | ICD-10-CM | POA: Diagnosis not present

## 2016-04-22 DIAGNOSIS — K529 Noninfective gastroenteritis and colitis, unspecified: Secondary | ICD-10-CM | POA: Diagnosis not present

## 2016-04-22 DIAGNOSIS — I7300 Raynaud's syndrome without gangrene: Secondary | ICD-10-CM

## 2016-04-22 DIAGNOSIS — Z8042 Family history of malignant neoplasm of prostate: Secondary | ICD-10-CM | POA: Diagnosis not present

## 2016-04-22 DIAGNOSIS — Z79899 Other long term (current) drug therapy: Secondary | ICD-10-CM | POA: Insufficient documentation

## 2016-04-22 DIAGNOSIS — K921 Melena: Secondary | ICD-10-CM

## 2016-04-22 DIAGNOSIS — D6851 Activated protein C resistance: Secondary | ICD-10-CM

## 2016-04-22 DIAGNOSIS — I82412 Acute embolism and thrombosis of left femoral vein: Secondary | ICD-10-CM

## 2016-04-22 NOTE — Progress Notes (Signed)
Patient's vitals are stable and documented.  Vitals stable and documented

## 2017-02-21 ENCOUNTER — Ambulatory Visit: Payer: BC Managed Care – PPO | Admitting: Internal Medicine

## 2017-02-24 ENCOUNTER — Ambulatory Visit: Payer: BC Managed Care – PPO | Admitting: Oncology

## 2018-01-30 IMAGING — US US EXTREM LOW VENOUS*L*
1 series · 13 of 24 positions shown · non-contrast
Comparison: None.

CLINICAL DATA: Left calf pain and edema. History of factor 5
Straub.



[Series 1: us extrem low venous*left* · 0.08mm/px · 13 of 36 slices shown]
[im 1/36]
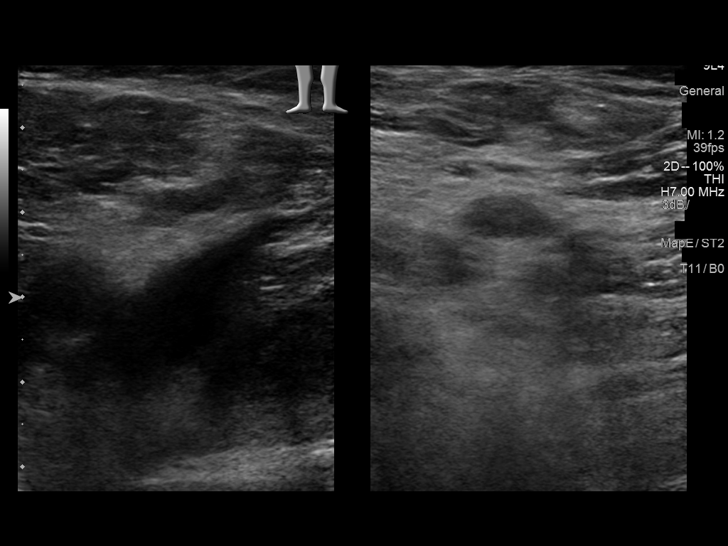
[im 4/36]
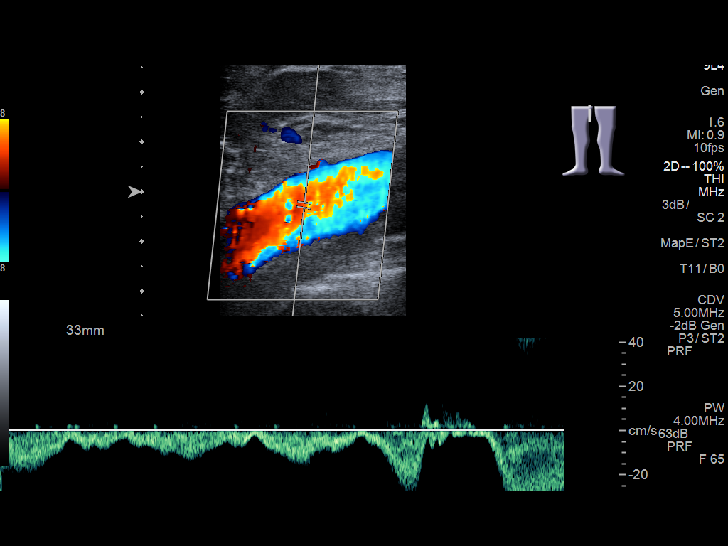
[im 7/36]
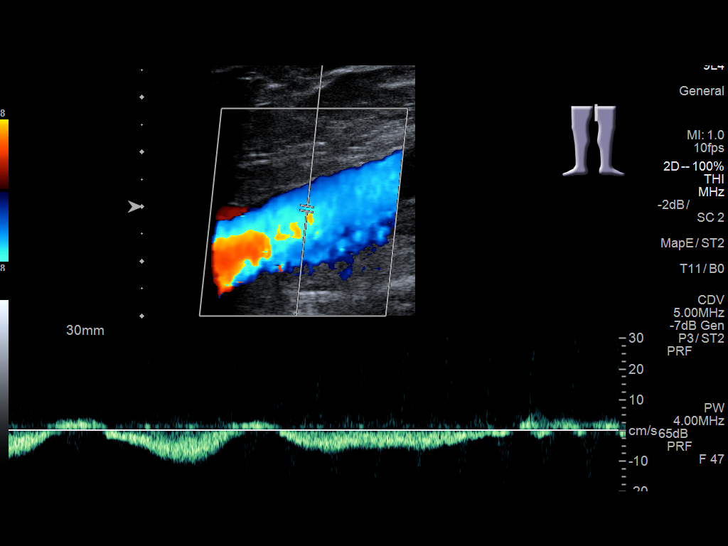
[im 10/36]
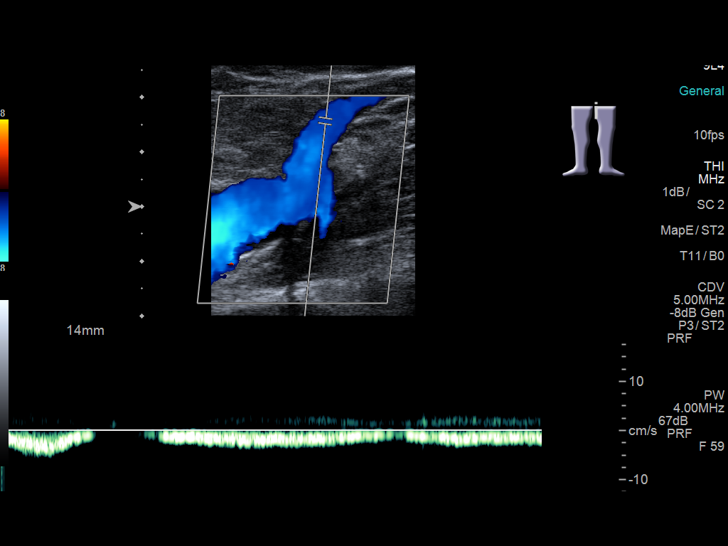
[im 13/36]
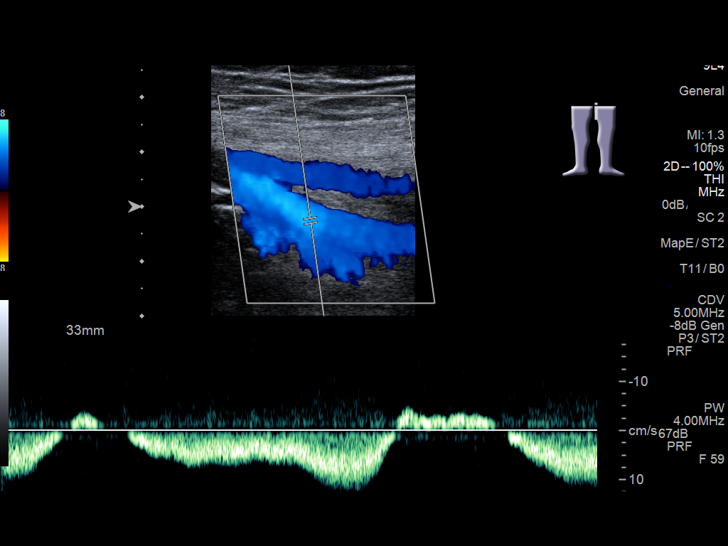
[im 16/36]
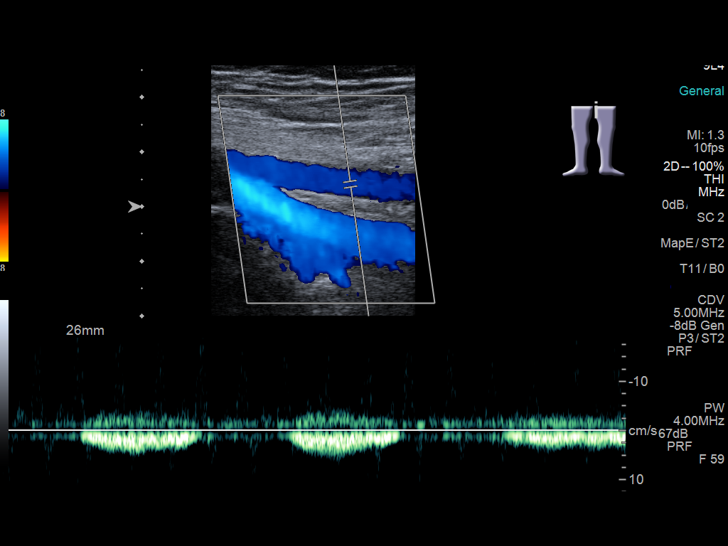
[im 19/36]
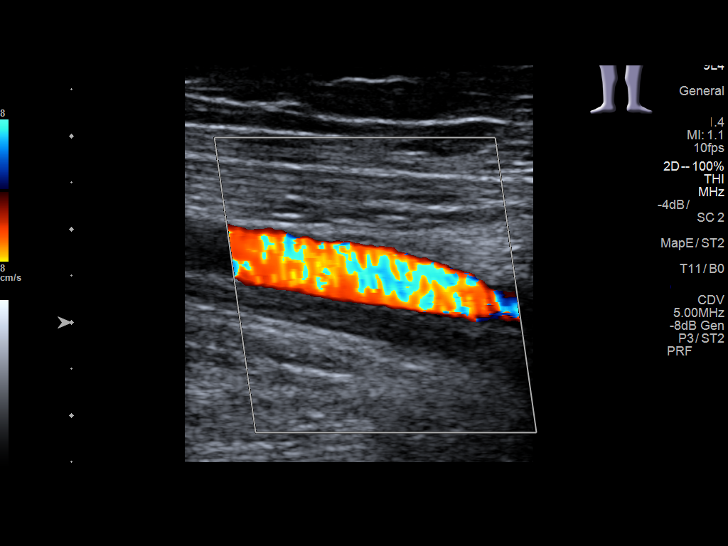
[im 20/36]
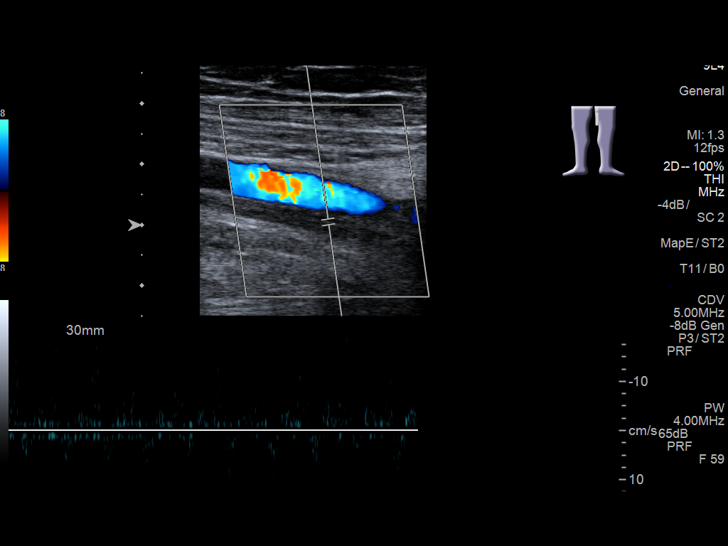
[im 23/36]
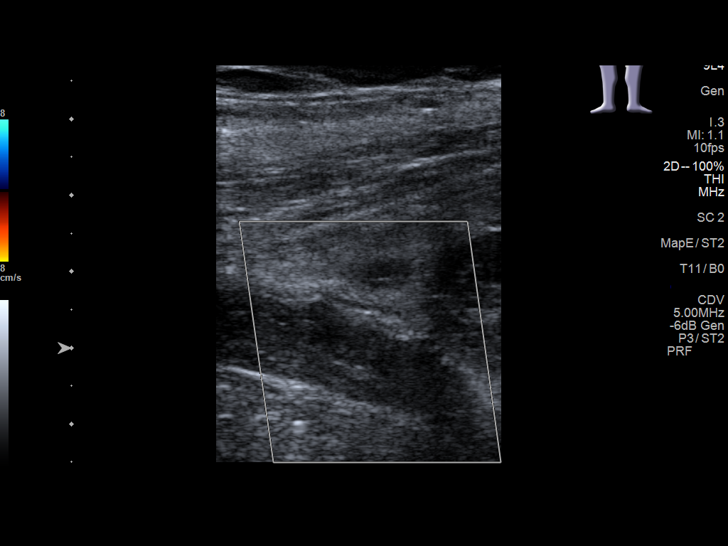
[im 26/36]
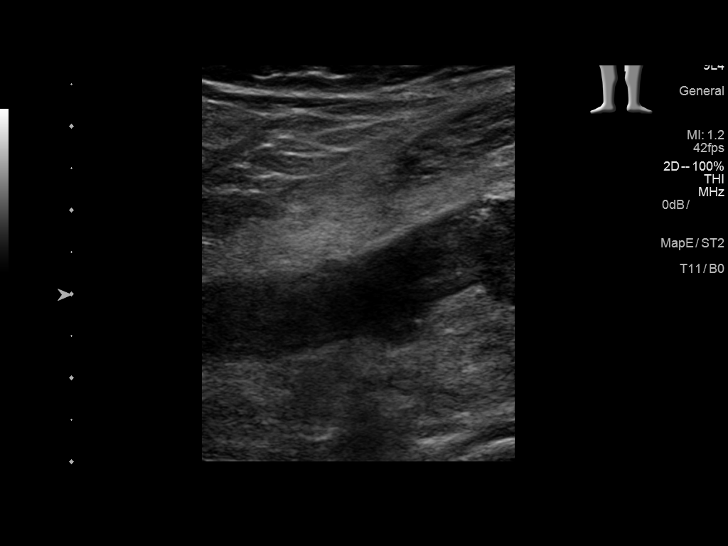
[im 29/36]
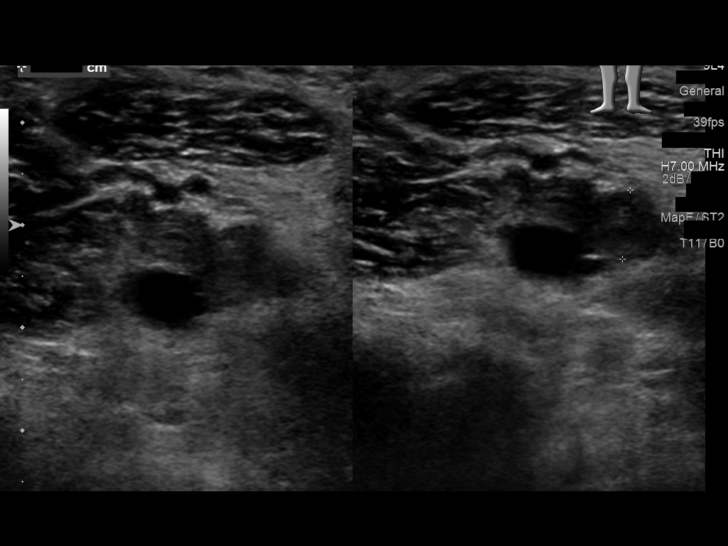
[im 32/36]
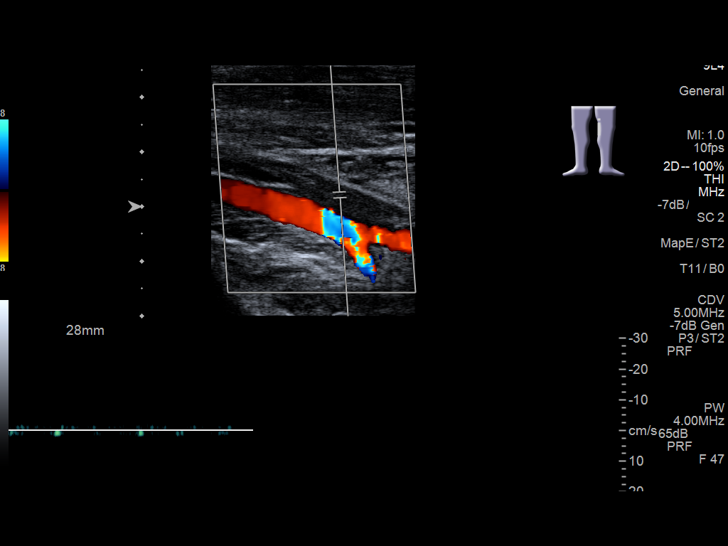
[im 36/36]
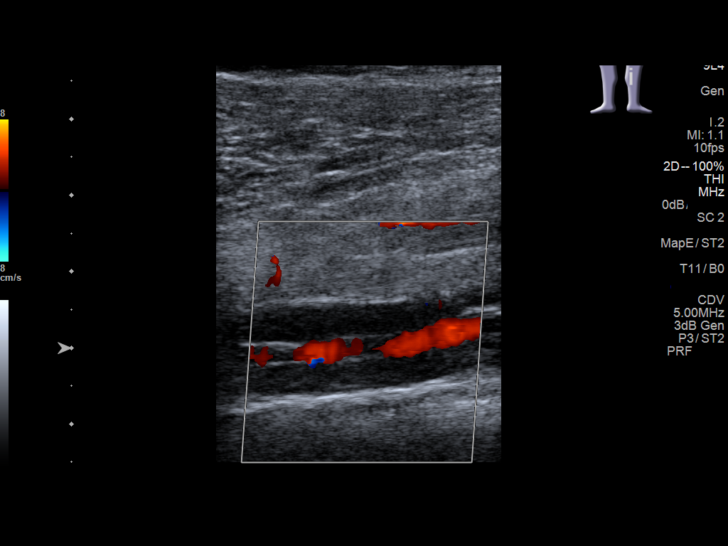

[13 of 24 positions shown; findings below may reference images not displayed]

FINDINGS: Contralateral Common Femoral Vein: Respiratory phasicity is normal
and symmetric with the symptomatic side. No evidence of thrombus.
Normal compressibility.

Common Femoral Vein: No evidence of thrombus. Normal
compressibility, respiratory phasicity and response to augmentation.

Saphenofemoral Junction: No evidence of thrombus. Normal
compressibility and flow on color Doppler imaging.

Profunda Femoral Vein: No evidence of thrombus. Normal
compressibility, respiratory phasicity and response to augmentation.

Femoral Vein: Beginning in the mid thigh, there is evidence of DVT
within the femoral vein which is nonocclusive in the mid thigh and
becomes occlusive in the distal thigh.

Popliteal Vein: Occlusive thrombus identified throughout the
popliteal vein.

Calf Veins: Thrombus extends into visualized peroneal and posterior
tibial veins.

Superficial Great Saphenous Vein: No evidence of thrombus. Normal
compressibility and flow on color Doppler imaging.

Venous Reflux:  None.

Other Findings: No evidence of superficial thrombophlebitis or
abnormal fluid collection.
IMPRESSION: The exam is positive for DVT of the left lower extremity beginning
in the femoral vein at the mid thigh level. Occlusive thrombus is
present in the distal thigh, popliteal vein and visualized calf
veins.

## 2020-03-23 ENCOUNTER — Other Ambulatory Visit: Payer: BC Managed Care – PPO

## 2024-03-30 ENCOUNTER — Ambulatory Visit
Admission: EM | Admit: 2024-03-30 | Discharge: 2024-03-30 | Disposition: A | Discharge status: Home / Self Care | Attending: Family Medicine | Admitting: Family Medicine

## 2024-03-30 ENCOUNTER — Encounter: Payer: Self-pay | Admitting: Emergency Medicine

## 2024-03-30 DIAGNOSIS — U071 COVID-19: Secondary | ICD-10-CM

## 2024-03-30 LAB — POC COVID19/FLU A&B COMBO
Covid Antigen, POC: POSITIVE — AB
Influenza A Antigen, POC: NEGATIVE
Influenza B Antigen, POC: NEGATIVE

## 2024-03-30 LAB — POCT RAPID STREP A (OFFICE): Rapid Strep A Screen: NEGATIVE

## 2024-03-30 NOTE — ED Triage Notes (Addendum)
 Patient c/o nasal congestion, scratchy throat, and cough that started yesterday.  Patient denies fevers.  Patient states that her family has been sick with URI.

## 2024-03-30 NOTE — ED Provider Notes (Signed)
 " [facility]    CSN: [id number] Arrival date & time: 03/30/24  1502      History   Chief Complaint Chief Complaint  Patient presents with   Cough   Sore Throat    HPI Miguel Rodgers is a 33 y.o. adult.   HPI  History obtained from the patient. Miguel Rodgers presents for scratchy sore throat, dry cough, body aches, headache, fatigue and post nasal drip that started 1-2 days. No sneezing, vomiting, nasal congestion. Has a flare up of his GI disease and had diarrhea but thinks the abdominal cramping and diarrhea are due to that. Taking Tylenol  with last dose before arrival.  Everyone has been sick around him. All his family that he lives with have had fever and cough.    Got his flu vaccine on Wednesday.       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 Authorizing Provider  ELIQUIS  5 MG TABS tablet TAKE 1 TABLET(5 MG) BY MOUTH TWICE DAILY 01/18/16  Yes Brahmanday, Govinda R, MD  emtricitabine-tenofovir (TRUVADA) 200-300 MG tablet Take 1  tablet by mouth daily. 01/09/24 01/08/25 Yes [provider]  estradiol (VIVELLE-DOT) 0.1 MG/24HR patch Place 1 patch onto the skin 2 (two) times a week. 02/05/24 02/04/25 Yes [provider]  progesterone (PROMETRIUM) 100 MG capsule Take 1 capsule (100mg ) for the first 15 days of the month followed by 2 capsules (200mg ) for the next 15 days. 08/07/23  Yes [provider]  cetirizine (ZYRTEC) 10 MG tablet Take 10 mg by mouth daily.    [provider]  desipramine  (NOPRAMIN) 10 MG tablet Take 4 tablets daily for total of 40mg  08/31/15   [provider]  diphenhydrAMINE (BENADRYL) 25 mg capsule Take by mouth.    [provider]  Loperamide HCl (IMODIUM PO) Take by mouth as needed.    [provider]  montelukast (SINGULAIR) 10 MG tablet  09/16/13   [provider]  simethicone  (MYLICON) 80 MG chewable tablet Chew 80 mg by mouth every 6 (six) hours as needed for flatulence. Reported on 07/01/2015    [provider]    Family History Family History  Problem Relation Age of Onset   Factor V Leiden deficiency Mother    Lupus Mother    Sleep apnea Mother    Prostate cancer Maternal Grandfather     Social History Social History[1]   Allergies   Other, Citalopram , and Pollen extract   Review of  Systems Review of Systems: negative unless otherwise stated in HPI.      Physical Exam Triage Vital Signs ED Triage Vitals  Encounter Vitals Group     BP 03/30/24 1519 112/74     Girls Systolic BP Percentile --      Girls Diastolic BP Percentile --      Boys Systolic BP Percentile --      Boys Diastolic BP Percentile --      Pulse Rate 03/30/24 1519 100     Resp 03/30/24 1519 15     Temp 03/30/24 1519 98.4 F (36.9 C)     Temp Source 03/30/24 1519 Oral     SpO2 03/30/24 1519 97 %     Weight 03/30/24 1514 144 lb (65.3 kg)     Height 03/30/24 1514 5' 6 (1.676 m)     Head Circumference --      Peak Flow --       Pain Score 03/30/24 1514 4     Pain Loc --      Pain Education --      Exclude from Growth Chart --    No data found.  Updated Vital Signs BP 112/74 (BP Location: Right Arm)   Pulse 100   Temp 98.4 F (36.9 C) (Oral)   Resp 15   Ht 5' 6 (1.676 m)   Wt 65.3 kg   SpO2 97%   BMI 23.24 kg/m   Visual Acuity Right Eye Distance:   Left Eye Distance:   Bilateral Distance:    Right Eye Near:   Left Eye Near:    Bilateral Near:     Physical Exam GEN:     alert, non-toxic appearing adult in no distress    HENT:  mucus membranes moist, oropharyngeal without lesions or erythema, no tonsillar hypertrophy or exudates, no nasal discharge,  EYES:   no scleral injection or discharge NECK:  normal ROM, no  lymphadenopathy,  no meningismus   RESP:  no increased work of breathing,  clear to auscultation bilaterally CVS:   regular rate  and rhythm Skin:   warm and dry     UC Treatments / Results  Labs (all labs ordered are listed, but only abnormal results are displayed) Labs Reviewed  POC COVID19/FLU A&B COMBO - Abnormal; Notable for the following components:      Result Value   Covid Antigen, POC Positive (*)    All other components within normal limits  POCT RAPID STREP A (OFFICE) - Normal    EKG   Radiology No results found.   Procedures Procedures (including critical care time)  Medications Ordered in UC Medications - No data to display  Initial Impression / Assessment and Plan / UC Course  I have reviewed the triage vital signs and the nursing notes.  Pertinent labs & imaging results that were available during my care of the patient were reviewed by me and considered in my medical decision making (see chart for details).       Pt is a 34 y.o. adult who presents for 1-2 days of respiratory symptoms. Miguel Rodgers is afebrile here with recent antipyretics. Satting well on room air. Overall pt is non-toxic appearing, well hydrated, without respiratory distress.  Pulmonary exam is unremarkable.  POC COVID and influenza panel obtained and was COVID positive.  Discussed symptomatic treatment. Explained lack of efficacy of antibiotics in viral disease.  Typical duration of symptoms discussed.   Return and ED precautions given and voiced understanding. Discussed  MDM, treatment plan and plan for follow-up with patient  who agrees with plan.     Final Clinical Impressions(s) / UC Diagnoses   Final diagnoses:  COVID-19     Discharge Instructions      Your test for COVID-19 was positive, meaning that you were infected with the novel coronavirus and could give the germ to others.  The recommendations suggest returning to normal activities when, for at least 24 hours, symptoms are improving overall, and if a fever was present, it has been gone without use of a fever-reducing medication.  You should wear a mask for the next 5 days to prevent the spread of disease. Please continue good preventive care measures, including:  frequent hand-washing, avoid touching your face, cover coughs/sneezes, stay out of crowds and keep a 6 foot distance from others.  Go to the nearest hospital emergency room if fever/cough/breathlessness are severe or illness seems like a threat to life.  If your were prescribed medication. Stop by the pharmacy to pick it up. You can take Tylenol  and/or Ibuprofen as needed for fever reduction and pain relief.    For cough: honey 1/2 to 1 teaspoon (you can dilute the honey in water  or another fluid).  You can also use guaifenesin and dextromethorphan for cough. You can use a humidifier for chest congestion and cough.  If you don't have a humidifier, you can sit in the bathroom with the hot shower running.      For sore throat: try warm salt water  gargles, Mucinex sore throat cough drops or cepacol lozenges, throat spray, warm tea or water  with lemon/honey, popsicles or ice, or OTC cold relief medicine for throat discomfort. You can also purchase  chloraseptic spray at the pharmacy or dollar store.   For congestion: take a daily anti-histamine like Zyrtec, Claritin, and a oral decongestant, such as pseudoephedrine.  You can also use Flonase 1-2 sprays in each nostril daily. Afrin is also a good option, if you do not have high blood pressure.    It is important to stay hydrated: drink plenty of fluids (water , gatorade/powerade/pedialyte, juices, or teas) to keep your throat moisturized and help further relieve irritation/discomfort.    Return or go to the Emergency Department if symptoms worsen or do not improve in the next few days      ED Prescriptions   None    PDMP not reviewed this encounter.     [1]  Social History Tobacco Use   Smoking status: Never   Smokeless tobacco: Never  Vaping Use   Vaping status: Never Used  Substance Use Topics   Alcohol use: No    Comment: occasional    Drug use: No     Kriste Berth, DO 03/30/24 1612  "

## 2024-03-30 NOTE — Discharge Instructions (Signed)
